# Patient Record
Sex: Male | Born: 1950 | Race: White | Hispanic: No | Smoking: Former smoker
Health system: Southern US, Community
[De-identification: ages and names within clinical notes are randomized; demographics above are authoritative.]

## PROBLEM LIST (undated history)

## (undated) DIAGNOSIS — Z955 Presence of coronary angioplasty implant and graft: Secondary | ICD-10-CM

## (undated) DIAGNOSIS — I714 Abdominal aortic aneurysm, without rupture: Secondary | ICD-10-CM

## (undated) DIAGNOSIS — I10 Essential (primary) hypertension: Secondary | ICD-10-CM

## (undated) DIAGNOSIS — E785 Hyperlipidemia, unspecified: Secondary | ICD-10-CM

## (undated) DIAGNOSIS — R9431 Abnormal electrocardiogram [ECG] [EKG]: Secondary | ICD-10-CM

## (undated) DIAGNOSIS — I25709 Atherosclerosis of coronary artery bypass graft(s), unspecified, with unspecified angina pectoris: Principal | ICD-10-CM

## (undated) DIAGNOSIS — I219 Acute myocardial infarction, unspecified: Secondary | ICD-10-CM

## (undated) HISTORY — DX: Presence of coronary angioplasty implant and graft: Z95.5

## (undated) HISTORY — DX: Hyperlipidemia, unspecified: E78.5

## (undated) HISTORY — DX: Abdominal aortic aneurysm, without rupture: I71.4

## (undated) HISTORY — PX: CARDIAC SURGERY: SHX584

## (undated) HISTORY — DX: Essential (primary) hypertension: I10

## (undated) HISTORY — DX: Atherosclerosis of coronary artery bypass graft(s), unspecified, with unspecified angina pectoris: I25.709

## (undated) HISTORY — DX: Abnormal electrocardiogram (ECG) (EKG): R94.31

---

## 2015-07-06 ENCOUNTER — Encounter: Payer: Self-pay | Admitting: *Deleted

## 2015-07-06 ENCOUNTER — Emergency Department (INDEPENDENT_AMBULATORY_CARE_PROVIDER_SITE_OTHER): Payer: Self-pay

## 2015-07-06 ENCOUNTER — Emergency Department
Admission: EM | Admit: 2015-07-06 | Discharge: 2015-07-06 | Disposition: A | Discharge status: Other Acute Inpatient Hospital | Payer: Self-pay | Source: Home / Self Care | Attending: Emergency Medicine | Admitting: Emergency Medicine

## 2015-07-06 DIAGNOSIS — R0609 Other forms of dyspnea: Secondary | ICD-10-CM | POA: Insufficient documentation

## 2015-07-06 DIAGNOSIS — R918 Other nonspecific abnormal finding of lung field: Secondary | ICD-10-CM

## 2015-07-06 DIAGNOSIS — R072 Precordial pain: Secondary | ICD-10-CM

## 2015-07-06 DIAGNOSIS — I10 Essential (primary) hypertension: Secondary | ICD-10-CM

## 2015-07-06 HISTORY — DX: Essential (primary) hypertension: I10

## 2015-07-06 HISTORY — DX: Acute myocardial infarction, unspecified: I21.9

## 2015-07-06 MED ORDER — NITROGLYCERIN 0.4 MG SL SUBL
0.4000 mg | SUBLINGUAL_TABLET | Freq: Once | SUBLINGUAL | Status: AC
  Administered 2015-07-06: 0.4 mg via SUBLINGUAL

## 2015-07-06 MED ORDER — GI COCKTAIL ~~LOC~~
30.0000 mL | Freq: Once | ORAL | Status: AC
  Administered 2015-07-06: 30 mL via ORAL

## 2015-07-06 NOTE — ED Notes (Signed)
Patient denies any change of symptoms post NTG. Still feels slight pressure/burning in chest with deep breath or cough.

## 2015-07-06 NOTE — ED Notes (Addendum)
Pt c/o cough, fatigue, HA and burning in chest x 1 week. Afebrile. SOB at times

## 2015-07-06 NOTE — ED Provider Notes (Addendum)
CSN: [id number]     Arrival date & time 07/06/15  5621 History   First MD Initiated Contact with Patient 07/06/15 0940     Chief Complaint  Patient presents with  . URI     Patient is a 64 y.o. male presenting with URI and chest pain. The history is provided by the patient.  URI Presenting symptoms: congestion (Anterior chest), cough (Nonproductive, 2 days) and sore throat (Minor symptom. Vague irritation in back of throat)   Presenting symptoms: no ear pain, no facial pain, no fatigue, no fever and no rhinorrhea   Severity:  Moderate Onset quality:  Gradual Duration:  7 days Timing:  Constant Progression:  Worsening Chronicity:  New Relieved by: Nothing in particular. Worsened by:  Breathing Associated symptoms: headaches (Mild, nonspecific. No focal neurologic symptoms. No change in vision)   Associated symptoms: no swollen glands and no wheezing   Associated symptoms comment:  Denies any new neck pain. He has occasional posterior neck pain that he feels is from arthritis, worse with moving his neck.--Not associated with chest discomfort Risk factors: chronic cardiac disease   Risk factors: no recent travel and no sick contacts   Chest Pain Chest pain location: Midline upper chest. Pain quality: burning   Pain radiates to:  Does not radiate Pain severity:  Moderate Onset quality:  Gradual Duration:  7 days Timing:  Intermittent Progression:  Worsening Context: breathing   Context: not eating and no trauma   Context comment:  Hurts to press on upper anterior chest Relieved by:  Rest Worsened by:  Exertion Associated symptoms: cough (Nonproductive, 2 days), dysphagia (Vague, nonspecific, mild), headache (Mild, nonspecific. No focal neurologic symptoms. No change in vision), heartburn (Occasional. Not currently that he can tell. He tried Tums this morning without improvement. ), shortness of breath (Worsens with exertion. For one week) and weakness (In general, feels weak at  times without any focal weakness)   Associated symptoms: no abdominal pain, no back pain, no diaphoresis, no dizziness, no fatigue, no fever, no lower extremity edema, no nausea, no numbness, no palpitations and not vomiting    The upper anterior midline chest discomfort is dull, burning, pleuritic, worse with coughing. 3 out of 10 at rest. 7 out of 10 with exertion. He states his PCP was in [facility], but is no longer available. He states he no longer has a PCP. He states last time he saw his PCP was 2 years ago. His cardiologist is at [facility]. Past medical history of MI 2 and CABG 2016. Has not followed up there  since 2014.  Family history: Mother had CHF, he is not sure if she had CAD. He is not sure age of onset of CHF. Father had lung and stomach cancer reviewed Past Medical History  Diagnosis Date  . Heart attack (HCC)   . Hypertension    Past Surgical History  Procedure Laterality Date  . Cardiac surgery     Family History  Problem Relation Age of Onset  . Congestive Heart Failure Mother   . Cancer Father     Lung and stomach CA   Social History  Substance Use Topics  . Smoking status: Former Smoker    Types: Cigarettes    Quit date: 07/05/2005  . Smokeless tobacco: Never Used  . Alcohol Use: No    Review of Systems  Constitutional: Negative for fever, diaphoresis and fatigue.  HENT: Positive for congestion (Anterior chest), sore throat (Minor symptom. Vague irritation in back  of throat) and trouble swallowing (Vague, nonspecific, mild). Negative for ear pain and rhinorrhea.   Respiratory: Positive for cough (Nonproductive, 2 days) and shortness of breath (Worsens with exertion. For one week). Negative for wheezing.   Cardiovascular: Positive for chest pain. Negative for palpitations.  Gastrointestinal: Positive for heartburn (Occasional. Not currently that he can tell. He tried Tums this morning without improvement. ). Negative for nausea, vomiting and  abdominal pain.  Musculoskeletal: Negative for back pain.  Neurological: Positive for weakness (In general, feels weak at times without any focal weakness) and headaches (Mild, nonspecific. No focal neurologic symptoms. No change in vision). Negative for dizziness and numbness.  All other systems reviewed and are negative. See also history of present illness.  Allergies  Vicodin  Home Medications   Prior to Admission medications   Medication Sig Start Date End Date Taking? Authorizing Provider  aspirin EC 81 MG tablet Take 81 mg by mouth daily.   Yes Historical Provider, MD  atorvastatin (LIPITOR) 80 MG tablet Take 80 mg by mouth daily.   Yes Historical Provider, MD  carvedilol (COREG) 6.25 MG tablet Take 6.25 mg by mouth 2 (two) times daily with a meal.   Yes Historical Provider, MD  clopidogrel (PLAVIX) 75 MG tablet Take 75 mg by mouth daily.   Yes Historical Provider, MD  isosorbide dinitrate (ISORDIL) 30 MG tablet Take 30 mg by mouth 4 (four) times daily.   Yes Historical Provider, MD   Meds Ordered and Administered this Visit   Medications  gi cocktail (Maalox,Lidocaine,Donnatal) (30 mLs Oral Given 07/06/15 1111)  nitroGLYCERIN (NITROSTAT) SL tablet 0.4 mg (0.4 mg Sublingual Given 07/06/15 1128)    BP 138/75 mmHg  Pulse 82  Temp(Src) 98.6 F (37 C) (Oral)  Resp 18  Ht 5\' 11"  (1.803 m)  Wt 250 lb (113.399 kg)  BMI 34.88 kg/m2  SpO2 96% No data found.   Physical Exam  Constitutional: He is oriented to person, place, and time. He appears well-developed and well-nourished. No distress.  Occasionally has nonspecific nonproductive cough. He appears worried.  HENT:  Head: Normocephalic and atraumatic.  Mouth/Throat: Oropharynx is clear and moist. No oropharyngeal exudate.  Eyes: Conjunctivae are normal. Pupils are equal, round, and reactive to light. No scleral icterus.  Neck: Normal range of motion. Neck supple. No JVD present.  Cardiovascular: Normal rate, regular  rhythm and intact distal pulses.  Exam reveals no gallop and no friction rub.   No murmur heard. Pulmonary/Chest: Effort normal and breath sounds normal. No stridor. He has no wheezes. He exhibits tenderness (mild, nonspecific tenderness to palpation  upper anterior chest  wall).  Abdominal: Soft. There is no tenderness.  Musculoskeletal: Normal range of motion. He exhibits no edema.  Lymphadenopathy:    He has no cervical adenopathy.  Neurological: He is alert and oriented to person, place, and time. No cranial nerve deficit.  No focal weakness  Skin: Skin is warm and dry. No rash noted. He is not diaphoretic.  Psychiatric: He has a normal mood and affect.    ED Course  ED EKG  Date/Time: 07/06/2015 11:09 AM Performed by: Georgina PillionMASSEY, DAVID Authorized by: Lajean ManesMASSEY, DAVID Interpreted by ED physician Previous ECG: no previous ECG available Rhythm: sinus rhythm Rate: normal BPM: 72 Conduction: conduction normal ST Elevation: V2, V3 and V4 (0.5 mm ST elevation in V2, V3, V4) T depression: V5 and V6 Clinical impression: abnormal ECG Comments: No old EKG available   GI cocktail given.--No improvement Nitroglycerin 0.4 sublingually given,  no significant improvement afterward.  Labs Review Labs Reviewed - No data to display  Imaging Review Dg Chest 2 View  07/06/2015  CLINICAL DATA:  Cough the T shortness of breath and burning in the chest for the past week, previous smoker, history of previous CABG. EXAM: CHEST  2 VIEW COMPARISON:  PA and lateral chest x-ray of July 03, 2013 FINDINGS: The lungs are adequately inflated. There is pleural thickening or pleural fluid posteriorly and laterally in the right hemi- thorax. The lateral component is more conspicuous than in the past. The left lung is clear. The heart and pulmonary vascularity are normal. The patient has undergone previous CABG. The mediastinum is normal in width. The bony thorax exhibits no acute abnormality. IMPRESSION: 1. There  is no evidence of pulmonary edema or alveolar pneumonia. 2. There is pleural thickening or pleural fluid at the right lung base. The configuration of this finding is different than in the however. Correlation with the patient's clinical exam and clinical symptoms is needed to exclude active pathology here. Chest CT scanning may be ultimately indicated to more fully evaluate the pleural spaces. Electronically Signed   By: David  [place] M.D.   On: 07/06/2015 12:20     MDM   1. Precordial pain    in the differential includes pleurisy, viral syndrome, or reflux, but he had no improvement after GI cocktail, making reflux less likely. Possibly costochondritis. Chest x-ray shows "the lungs are adequately inflated". no evidence of pulmonary edema or pneumonia but there is some type of pleural thickening or pleural fluid at the right lung base, see chest x-ray report above. He certainly is at risk for cardiac event, given prior history of CAD.(And non-follow-up with any M.D. the past 2 years)  I explained to him that he might be having a subacute or acute heart event that is causing his symptoms.--Although vital signs are stable, O2 saturation repeated at 96% on room air.  ED EKG  Previous ECG: no previous ECG available Rhythm: sinus rhythm Rate: normal BPM: 72 Conduction: conduction normal ST Elevation: V2, V3 and V4 (0.5 mm ST elevation in V2, V3, V4) T wave depression/inversion: V5 and V6 Clinical impression: abnormal ECG--does not meet criteria for STEMI    I explained to him that he might be having a subacute or acute heart event that is causing his symptoms. I advised transport stat via EMS to Hospital. He initially refused transport.  He is having family member come here to [facility],to drive him to emergency room. I explained risks of not allowing transport to hospital emergency room. Explain risks of death en route if he is driven by family member. Rechecked vital signs which  were stable, including O2 saturation 96% on room air. He declined aspirin. He declined oxygen.  11:53 PM. His wife arrived and I was able to convince patient and wife to allow [place] to call EMS for transport. They prefer transport to [facility], where he had his heart surgery 2014, and where his cardiologist at [facility] is associated.  EMS arrived and transported patient.  Lajean Manes, MD 07/06/15 1201  Lajean Manes, MD 07/06/15 [phone]

## 2015-07-10 ENCOUNTER — Telehealth: Payer: Self-pay | Admitting: *Deleted

## 2015-07-16 DIAGNOSIS — G4700 Insomnia, unspecified: Secondary | ICD-10-CM | POA: Insufficient documentation

## 2016-05-31 ENCOUNTER — Encounter: Payer: Self-pay | Admitting: Emergency Medicine

## 2016-05-31 ENCOUNTER — Emergency Department (INDEPENDENT_AMBULATORY_CARE_PROVIDER_SITE_OTHER): Payer: Self-pay

## 2016-05-31 ENCOUNTER — Emergency Department
Admission: EM | Admit: 2016-05-31 | Discharge: 2016-05-31 | Disposition: A | Discharge status: Home / Self Care | Payer: Self-pay | Source: Home / Self Care | Attending: Family Medicine | Admitting: Family Medicine

## 2016-05-31 DIAGNOSIS — M25511 Pain in right shoulder: Secondary | ICD-10-CM

## 2016-05-31 DIAGNOSIS — S4991XA Unspecified injury of right shoulder and upper arm, initial encounter: Secondary | ICD-10-CM

## 2016-05-31 DIAGNOSIS — W11XXXA Fall on and from ladder, initial encounter: Secondary | ICD-10-CM

## 2016-05-31 NOTE — ED Triage Notes (Signed)
Pt c/o right shoulder pain after falling from a ladder around noon today. States he landed on his right shoulder. Decrease ROM. Took ibuprofen at 1pm.

## 2016-05-31 NOTE — ED Provider Notes (Signed)
CSN: [id number]     Arrival date & time 05/31/16  1628 History   First MD Initiated Contact with Patient 05/31/16 1642     Chief Complaint  Patient presents with  . Shoulder Pain   (Consider location/radiation/quality/duration/timing/severity/associated sxs/prior Treatment) HPI Jerome Chase is a 65 y.o. male presenting to UC with c/o sudden onset Right shoulder pain with limited ROM after he fell off a ladder and landed onto his shoulder around noon today.  He took ibuprofen at 1PM but pain still present. Pt concerned he may have broken something.  Pain is 6/10, worse with movement. He is Right hand dominant. Denies hitting his head or any other injuries.    Past Medical History:  Diagnosis Date  . Heart attack (HCC)   . Hypertension    Past Surgical History:  Procedure Laterality Date  . CARDIAC SURGERY     Family History  Problem Relation Age of Onset  . Congestive Heart Failure Mother   . Cancer Father     Lung and stomach CA   Social History  Substance Use Topics  . Smoking status: Former Smoker    Types: Cigarettes    Quit date: 07/05/2005  . Smokeless tobacco: Never Used  . Alcohol use No    Review of Systems  Musculoskeletal: Positive for arthralgias and myalgias. Negative for back pain, joint swelling, neck pain and neck stiffness.       Right shoulder  Skin: Negative for color change and wound.  Neurological: Positive for weakness (Right shoulder due to pain). Negative for dizziness, syncope, light-headedness, numbness and headaches.    Allergies  Vicodin [hydrocodone-acetaminophen]  Home Medications   Prior to Admission medications   Medication Sig Start Date End Date Taking? Authorizing Provider  aspirin EC 81 MG tablet Take 81 mg by mouth daily.    Historical Provider, MD  atorvastatin (LIPITOR) 80 MG tablet Take 80 mg by mouth daily.    Historical Provider, MD  carvedilol (COREG) 6.25 MG tablet Take 6.25 mg by mouth 2 (two) times daily with a  meal.    Historical Provider, MD  clopidogrel (PLAVIX) 75 MG tablet Take 75 mg by mouth daily.    Historical Provider, MD  isosorbide dinitrate (ISORDIL) 30 MG tablet Take 30 mg by mouth 4 (four) times daily.    Historical Provider, MD   Meds Ordered and Administered this Visit  Medications - No data to display  BP 152/76 (BP Location: Right Arm)   Pulse 60   Temp 97.6 F (36.4 C) (Oral)   Wt 248 lb (112.5 kg)   SpO2 97%   BMI 34.59 kg/m  No data found.   Physical Exam  Constitutional: He is oriented to person, place, and time. He appears well-developed and well-nourished.  HENT:  Head: Normocephalic and atraumatic.  Eyes: EOM are normal.  Neck: Normal range of motion. Neck supple.  No midline bone tenderness, no crepitus or step-offs.   Cardiovascular: Normal rate.   Pulses:      Radial pulses are 2+ on the right side.  Pulmonary/Chest: Effort normal.  Musculoskeletal: He exhibits tenderness. He exhibits no edema.  Right shoulder: no edema or deformity. Tenderness to anterior aspect and over deltoid.  Full adduction and near full abduction but limited due to pain.  Right elbow: full ROM, non-tender. No midline spinal tenderness.  Neurological: He is alert and oriented to person, place, and time.  Skin: Skin is warm and dry. Capillary refill takes less than 2 seconds.  Right shoulder: skin in tact, no ecchymosis or erythema.  Psychiatric: He has a normal mood and affect. His behavior is normal.  Nursing note and vitals reviewed.   Urgent Care Course   Clinical Course    Procedures (including critical care time)  Labs Review Labs Reviewed - No data to display  Imaging Review Dg Shoulder Right  Result Date: 05/31/2016 CLINICAL DATA:  Initial encounter for Pt states he fell off a ladder this afternoon about 5 feet up and landed on concrete. States he has been having right shoulder pain on the lateral side with decreased ROM since. EXAM: RIGHT SHOULDER - 2+ VIEW  COMPARISON:  None. FINDINGS: Degenerate changes of the undersurface of the acromioclavicular joint. Prior median sternotomy. No acute fracture or dislocation. Mild glenohumeral joint osteoarthritis as well. IMPRESSION: Degenerative change, without acute osseous finding. Electronically Signed   By: Jeronimo Greaves M.D.   On: 05/31/2016 17:04    MDM   1. Right shoulder injury, initial encounter   2. Fall from ladder, initial encounter    Pt c/o Right shoulder pain after fall from ladder. No other injury.  Plain films: no acute findings.   Reassured pt no fracture or dislocation, however advised he may have a partial rotator cuff tear. Recommended sling for comfort and home exercises. Pt declined sling.  Pt is on Plavix, adamant he did not hit his head or have any other injuries from fall. Encouraged to call 911 or go to closest emergency department if he develops new symptoms this evening.  F/u with Sports Medicine in 1 week if not improving.  Patient verbalized understanding and agreement with treatment plan.     Junius Finner, PA-C 05/31/16 1732

## 2017-01-24 ENCOUNTER — Emergency Department
Admission: EM | Admit: 2017-01-24 | Discharge: 2017-01-24 | Disposition: A | Discharge status: Home / Self Care | Payer: Self-pay | Source: Home / Self Care | Attending: Family Medicine | Admitting: Family Medicine

## 2017-01-24 ENCOUNTER — Encounter: Payer: Self-pay | Admitting: Emergency Medicine

## 2017-01-24 ENCOUNTER — Telehealth: Payer: Self-pay | Admitting: Emergency Medicine

## 2017-01-24 ENCOUNTER — Ambulatory Visit (HOSPITAL_BASED_OUTPATIENT_CLINIC_OR_DEPARTMENT_OTHER)
Admit: 2017-01-24 | Discharge: 2017-01-24 | Disposition: A | Discharge status: Home / Self Care | Payer: Medicare (Managed Care) | Source: Ambulatory Visit | Attending: Family Medicine | Admitting: Family Medicine

## 2017-01-24 DIAGNOSIS — R1013 Epigastric pain: Secondary | ICD-10-CM

## 2017-01-24 DIAGNOSIS — R935 Abnormal findings on diagnostic imaging of other abdominal regions, including retroperitoneum: Secondary | ICD-10-CM | POA: Insufficient documentation

## 2017-01-24 DIAGNOSIS — R1011 Right upper quadrant pain: Secondary | ICD-10-CM

## 2017-01-24 DIAGNOSIS — M549 Dorsalgia, unspecified: Secondary | ICD-10-CM

## 2017-01-24 DIAGNOSIS — K760 Fatty (change of) liver, not elsewhere classified: Secondary | ICD-10-CM | POA: Insufficient documentation

## 2017-01-24 LAB — COMPLETE METABOLIC PANEL WITH GFR
ALT: 37 U/L (ref 9–46)
AST: 26 U/L (ref 10–35)
Albumin: 4.5 g/dL (ref 3.6–5.1)
Alkaline Phosphatase: 80 U/L (ref 40–115)
BUN: 12 mg/dL (ref 7–25)
CO2: 21 mmol/L (ref 20–31)
Calcium: 9.2 mg/dL (ref 8.6–10.3)
Chloride: 103 mmol/L (ref 98–110)
Creat: 1.05 mg/dL (ref 0.70–1.25)
GFR, Est African American: 86 mL/min (ref >=60)
GFR, Est Non African American: 74 mL/min (ref >=60)
Glucose, Bld: 159 mg/dL — ABNORMAL HIGH (ref 65–99)
Potassium: 4.3 mmol/L (ref 3.5–5.3)
Sodium: 136 mmol/L (ref 135–146)
Total Bilirubin: 0.6 mg/dL (ref 0.2–1.2)
Total Protein: 7 g/dL (ref 6.1–8.1)

## 2017-01-24 LAB — LIPASE: Lipase: 43 U/L (ref 7–60)

## 2017-01-24 NOTE — ED Provider Notes (Signed)
CSN: [id number]     Arrival date & time 01/24/17  [id number] History   First MD Initiated Contact with Patient 01/24/17 [phone]     Chief Complaint  Patient presents with  . Abdominal Pain   (Consider location/radiation/quality/duration/timing/severity/associated sxs/prior Treatment) HPI Jerome Chase is a 66 y.o. male presenting to UC with c/o epigastric pain that started about 5 days ago, waxes and wanes. Aching and sore, worse with certain movements, radiates to his back. 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  Denies chest pain, SOB, or acid reflux. Denies fever, chills, n/v/d. No hx of pancreatitis or gallbladder issues.  He has an extensive cardiac hx including a CABG.  He called his cardiologist earlier this week about symptoms. Doubtful related to his heart as pt just f/u with cardiologist last month and even wore a Holter monitor, results were unremarkable per notes on CareEverywhere from 12/15/16.  Pt states this does not feel like his prior chest/heart pain.     Past Medical History:  Diagnosis Date  . Heart attack (HCC)   . Hypertension    Past Surgical History:  Procedure Laterality Date  . CARDIAC SURGERY     Family History  Problem Relation Age of Onset  . Congestive Heart Failure Mother   . Cancer Father        Lung and stomach CA   Social History  Substance Use Topics  . Smoking status: Former Smoker    Types: Cigarettes    Quit date: 07/05/2005  . Smokeless tobacco: Never Used  . Alcohol use No    Review of Systems  Constitutional: Negative for chills and fever.  HENT: Negative for congestion, ear pain, sore throat, trouble swallowing and voice change.   Respiratory: Negative for cough and shortness of breath.   Cardiovascular: Negative for chest pain and palpitations.  Gastrointestinal: Positive for abdominal pain (Epigastric, RUQ).  Negative for diarrhea, nausea and vomiting.  Genitourinary: Negative for flank pain.  Musculoskeletal: Positive for back pain (Right mid back). Negative for arthralgias and myalgias.  Skin: Negative for rash.    Allergies  Vicodin [hydrocodone-acetaminophen]  Home Medications   Prior to Admission medications   Medication Sig Start Date End Date Taking? Authorizing Provider  aspirin EC 81 MG tablet Take 81 mg by mouth daily.    [provider]  atorvastatin (LIPITOR) 80 MG tablet Take 80 mg by mouth daily.    [provider]  carvedilol (COREG) 6.25 MG tablet Take 6.25 mg by mouth 2 (two) times daily with a meal.    [provider]  clopidogrel (PLAVIX) 75 MG tablet Take 75 mg by mouth daily.    [provider]  isosorbide dinitrate (ISORDIL) 30 MG tablet Take 30 mg by mouth 4 (four) times daily.    [provider]   Meds Ordered and Administered this Visit  Medications - No data to display  BP 135/86 (BP Location: Right Arm)   Pulse (!) 58   Temp 97.8 F (36.6 C) (Oral)   Wt 246 lb (111.6 kg)   SpO2 96%   BMI 34.31 kg/m  No data found.   Physical Exam  Constitutional: He is oriented to person, place, and time. He appears well-developed and well-nourished. No distress.  HENT:  Head: Normocephalic and atraumatic.  Eyes: EOM are normal.  Neck: Normal range of motion. Neck  supple.  Cardiovascular: Normal rate and regular rhythm.   Pulmonary/Chest: Effort normal and breath sounds normal. No respiratory distress. He has no wheezes. He has no rales.  Abdominal: Soft. He exhibits no distension and no mass. There is tenderness. There is no rebound, no guarding and no CVA tenderness.    Mild tenderness in epigastrium and RUQ  Musculoskeletal: Normal range of motion.  Neurological: He is alert and oriented to person, place, and time.  Skin: Skin is warm and dry. He is not diaphoretic.  Psychiatric: He has a normal mood and affect. His  behavior is normal.  Nursing note and vitals reviewed.   Urgent Care Course     Procedures (including critical care time)  Labs Review Labs Reviewed  COMPLETE METABOLIC PANEL WITH GFR  LIPASE  POCT CBC W AUTO DIFF ([facility])    Imaging Review US Abdomen Complete  Result Date: 01/24/2017 CLINICAL DATA:  Right upper quadrant pain for 1 week. EXAM: ABDOMEN ULTRASOUND COMPLETE COMPARISON:  None. FINDINGS: Gallbladder: No gallstones or wall thickening visualized. No sonographic Murphy sign noted by sonographer. Common bile duct: Diameter: 4 mm. Liver: Diffusely increased in echogenicity without focal mass. IVC: No abnormality visualized. Pancreas: Visualized portion unremarkable. Spleen: Size and appearance within normal limits. Right Kidney: Length: 13.6 cm. Echogenicity within normal limits. No mass or hydronephrosis visualized. Left Kidney: Length: 14.6 cm. Normal echogenicity. There is a fluid collection in the central lower pole of the left kidney most likely representing hydronephrosis which is low worse in the lower pole there no definite solid mass. Abdominal aorta: Portions of the aorta were obscured. Maximal visualized diameter is 3.1 cm. Other findings: None. IMPRESSION: Possible left hydronephrosis. CT can be performed to further delineate. Portions of the aorta were obscured. Maximal visualized caliber was 3.1 cm. Recommend followup by ultrasound in 3 years. This recommendation follows ACR consensus guidelines: White Paper of the ACR Incidental Findings Committee II on Vascular Findings. J Am Coll Radiol 2013; 10:789-794. Diffuse hepatic steatosis. Electronically Signed   By: Jolaine Click M.D.   On: 01/24/2017 10:28     MDM   1. Acute epigastric pain   2. Right upper quadrant abdominal pain   3. Mid back pain on right side    Pt c/o epigastric and RUQ abdominal pain that occasionally radiates to back. Denies chest pain or SOB. Pain is worse with movement.   Pt sent  to [facility] for U/S abdomen   10:36 AM discussed U/S results with pt. Unsure what is causing pt's symptoms at this time.  Offered to have pt return to this UC for a GI cocktail and EKG, however, pain is atypical for ACS and pt had recent normal workup with his cardiologist.  Pt plans to f/u with PCP next week if not improving.   Advised pt not to hesitate to call 911 or go to hospital if symptoms worsen. Pt agreeable.     Junius Finner, PA-C 01/24/17 1106

## 2017-01-24 NOTE — ED Triage Notes (Signed)
Pt c/o sternum pain that radiates to back with movement x1 week. Tender to touch, taking tylenol with no relief. Hx of heart complications.

## 2017-01-26 LAB — POCT CBC W AUTO DIFF (K'VILLE URGENT CARE)

## 2017-02-26 ENCOUNTER — Emergency Department
Admission: EM | Admit: 2017-02-26 | Discharge: 2017-02-26 | Disposition: A | Discharge status: Home / Self Care | Payer: Self-pay | Source: Home / Self Care | Attending: Family Medicine | Admitting: Family Medicine

## 2017-02-26 ENCOUNTER — Encounter: Payer: Self-pay | Admitting: Emergency Medicine

## 2017-02-26 ENCOUNTER — Emergency Department (INDEPENDENT_AMBULATORY_CARE_PROVIDER_SITE_OTHER): Payer: Self-pay

## 2017-02-26 DIAGNOSIS — R0781 Pleurodynia: Secondary | ICD-10-CM

## 2017-02-26 DIAGNOSIS — G8929 Other chronic pain: Secondary | ICD-10-CM

## 2017-02-26 DIAGNOSIS — R1013 Epigastric pain: Secondary | ICD-10-CM

## 2017-02-26 DIAGNOSIS — M62830 Muscle spasm of back: Secondary | ICD-10-CM

## 2017-02-26 MED ORDER — METHOCARBAMOL 500 MG PO TABS: 500.0000 mg | ORAL_TABLET | Freq: Two times a day (BID) | ORAL | 0 refills | Status: DC | PRN

## 2017-02-26 NOTE — ED Triage Notes (Signed)
Mid-back pain, epigastric pain started yesterday afternoon, worse last night, couldn't sleep. Back spasms.

## 2017-02-26 NOTE — ED Provider Notes (Signed)
CSN: [id number]     Arrival date & time 02/26/17  1120 History   First MD Initiated Contact with Patient 02/26/17 1140     Chief Complaint  Patient presents with  . Back Pain   (Consider location/radiation/quality/duration/timing/severity/associated sxs/prior Treatment) HPI Jerome Chase is a 66 y.o. male presenting to UC with c/o bilateral mid back pain that started yesterday afternoon, worsened last night making it difficult for him to sleep. He also developed epigastric pain and is unsure which came first or if one is causing the other. [facility].  Labs including CMP and Lipase were normal.  Ultrasound of upper abdomen was also normal, no cause was found for pt's reported pain then. He does report hx of acid reflux and is suppose to take Nexium but notes he only takes as needed. He is concerned if he takes it too often it will not work anymore.  He has tried Tums but no relief. He also tried Naproxen w/o.  He does have hx of MI and cardiac surgery so he takes Plavix.  He f/u with cardiologist routinely but he does not have a PCP. He has not tried to establish care with a PCP since [facility].   Past Medical History:  Diagnosis Date  . Heart attack (HCC)   . Hypertension    Past Surgical History:  Procedure Laterality Date  . CARDIAC SURGERY     Family History  Problem Relation Age of Onset  . Congestive Heart Failure Mother   . Cancer Father        Lung and stomach CA   Social History  Substance Use Topics  . Smoking status: Former Smoker    Types: Cigarettes    Quit date: 07/05/2005  . Smokeless tobacco: Never Used  . Alcohol use No    Review of Systems  Constitutional: Negative for chills and fever.  HENT: Negative for congestion, ear pain, sore throat, trouble swallowing and voice change.   Respiratory: Negative for cough and shortness of breath.   Cardiovascular: Positive for chest pain (under bilateral lower  ribcage). Negative for palpitations.  Gastrointestinal: Positive for abdominal pain. Negative for diarrhea, nausea and vomiting.  Musculoskeletal: Positive for back pain and myalgias. Negative for arthralgias, neck pain and neck stiffness.  Skin: Negative for color change, rash and wound.  All other systems reviewed and are negative.   Allergies  Vicodin [hydrocodone-acetaminophen]  Home Medications   Prior to Admission medications   Medication Sig Start Date End Date Taking? Authorizing Provider  aspirin EC 81 MG tablet Take 81 mg by mouth daily.    [provider]  atorvastatin (LIPITOR) 80 MG tablet Take 80 mg by mouth daily.    [provider]  carvedilol (COREG) 6.25 MG tablet Take 6.25 mg by mouth 2 (two) times daily with a meal.    [provider]  clopidogrel (PLAVIX) 75 MG tablet Take 75 mg by mouth daily.    [provider]  isosorbide dinitrate (ISORDIL) 30 MG tablet Take 30 mg by mouth 4 (four) times daily.    [provider]  methocarbamol (ROBAXIN) 500 MG tablet Take 1 tablet (500 mg total) by mouth 2 (two) times daily as needed for muscle spasms. 02/26/17   Lurene ShadowPhelps, Renada Cronin O, PA-C   Meds Ordered and Administered this Visit  Medications - No data to display  BP (!) 149/82 (BP Location: Left Arm)   Pulse Marland Kitchen(!)  54   Temp 97.6 F (36.4 C) (Oral)   Ht 6' (1.829 m)   Wt 243 lb (110.2 kg)   SpO2 96%   BMI 32.96 kg/m  No data found.   Physical Exam  Constitutional: He is oriented to person, place, and time. He appears well-developed and well-nourished. No distress.  HENT:  Head: Normocephalic and atraumatic.  Mouth/Throat: Oropharynx is clear and moist.  Eyes: EOM are normal.  Neck: Normal range of motion.  Cardiovascular: Normal rate and regular rhythm.   Pulmonary/Chest: Effort normal and breath sounds normal. No respiratory distress. He has no wheezes. He has no rales. He exhibits tenderness (along lower ribcage. no  deformity or crepitus.).  Abdominal: Soft. He exhibits no distension and no mass. There is tenderness (across upper abdomen, worst in epigastrium). There is no rebound and no guarding.  Musculoskeletal: Normal range of motion. He exhibits tenderness. He exhibits no edema.  No midline spinal tenderness. Tenderness to bilateral mid-thoracic muscles.  Full ROM upper and lower extremities. Increased pain with certain movements during exam  Neurological: He is alert and oriented to person, place, and time.  Skin: Skin is warm and dry. No rash noted. He is not diaphoretic. No erythema.  No rash or ecchymosis on abdomen or back  Psychiatric: He has a normal mood and affect. His behavior is normal.  Nursing note and vitals reviewed.   Urgent Care Course     Procedures (including critical care time)  Labs Review Labs Reviewed - No data to display  Imaging Review Dg Chest 2 View  Result Date: 02/26/2017 CLINICAL DATA:  Lower rib and epigastric pain. EXAM: CHEST  2 VIEW COMPARISON:  CT chest and PA and lateral chest 07/06/2015. FINDINGS: The patient has a very small, chronic right pleural effusion versus pleural scar. The lungs are clear. No pneumothorax. The patient is status post CABG. Heart size is normal. Aortic atherosclerosis is noted. No acute bony abnormality. IMPRESSION: No acute disease. Electronically Signed   By: Drusilla Kanner M.D.   On: 02/26/2017 12:16    MDM   1. Muscle spasm of back   2. Abdominal pain, chronic, epigastric    CXR: unremarkable Labs and imaging from last visit unremarkable  Back pain most c/w muscle spasms.   Epigastric pain- questionably due to gastric or duodenal ulcer. Offered to give referral info to [facility]. Pt declined but is somoewhat more open to establishing care with a PCP.  Encouraged to start taking his Nexium again for at least 2 weeks to see if that helps. Will treat back muscle spasms in meantime. Rx: Robaxin.  Discussed risk of drowsiness, advised not to drive or drink alcohol while taking  F/u with PCP for continued care and further evaluation of chronic epigastric pain.    Lurene Shadow, [place] 02/26/17 1354

## 2017-02-26 NOTE — Discharge Instructions (Signed)
°  Robaxin (methocarbamol) is a muscle relaxer and may cause drowsiness. Do not drink alcohol, drive, or operate heavy machinery while taking. ° °

## 2017-05-07 LAB — PSA: PSA: 0.48

## 2017-05-07 LAB — HM HEPATITIS C SCREENING LAB: HM HEPATITIS C SCREENING: NEGATIVE

## 2017-05-20 DIAGNOSIS — I25709 Atherosclerosis of coronary artery bypass graft(s), unspecified, with unspecified angina pectoris: Secondary | ICD-10-CM

## 2017-05-20 DIAGNOSIS — R0782 Intercostal pain: Secondary | ICD-10-CM | POA: Insufficient documentation

## 2017-05-20 DIAGNOSIS — E7849 Other hyperlipidemia: Secondary | ICD-10-CM | POA: Insufficient documentation

## 2017-05-20 HISTORY — DX: Atherosclerosis of coronary artery bypass graft(s), unspecified, with unspecified angina pectoris: I25.709

## 2017-05-28 DIAGNOSIS — Z955 Presence of coronary angioplasty implant and graft: Secondary | ICD-10-CM

## 2017-05-28 DIAGNOSIS — R9389 Abnormal findings on diagnostic imaging of other specified body structures: Secondary | ICD-10-CM | POA: Insufficient documentation

## 2017-05-28 DIAGNOSIS — E785 Hyperlipidemia, unspecified: Secondary | ICD-10-CM

## 2017-05-28 HISTORY — DX: Presence of coronary angioplasty implant and graft: Z95.5

## 2017-05-28 HISTORY — DX: Hyperlipidemia, unspecified: E78.5

## 2017-09-10 LAB — CBC AND DIFFERENTIAL
HCT: 47 (ref 41–53)
HEMOGLOBIN: 16.4 (ref 13.5–17.5)
Platelets: 203 (ref 150–399)
WBC: 7.7

## 2017-09-10 LAB — VITAMIN D 25 HYDROXY (VIT D DEFICIENCY, FRACTURES): Vit D, 25-Hydroxy: 21.52

## 2017-09-10 LAB — LIPID PANEL
CHOLESTEROL: 245 — AB (ref 0–200)
HDL: 34 — AB (ref 35–70)
LDL Cholesterol: 135
TRIGLYCERIDES: 384 — AB (ref 40–160)

## 2017-09-10 LAB — BASIC METABOLIC PANEL
BUN: 14 (ref 4–21)
Creatinine: 1.2 (ref 0.6–1.3)
Glucose: 135
POTASSIUM: 3.8 (ref 3.4–5.3)
SODIUM: 137 (ref 137–147)

## 2017-09-10 LAB — VITAMIN B12: VITAMIN B 12: 494

## 2017-09-10 LAB — TSH: TSH: 4.5 (ref 0.41–5.90)

## 2017-09-10 LAB — HEPATIC FUNCTION PANEL
ALT: 63 — AB (ref 10–40)
AST: 41 — AB (ref 14–40)
Alkaline Phosphatase: 81 (ref 25–125)

## 2017-09-10 LAB — HEMOGLOBIN A1C: HEMOGLOBIN A1C: 6

## 2017-09-18 DIAGNOSIS — M659 Synovitis and tenosynovitis, unspecified: Secondary | ICD-10-CM | POA: Insufficient documentation

## 2017-09-18 DIAGNOSIS — M79671 Pain in right foot: Secondary | ICD-10-CM

## 2017-09-18 DIAGNOSIS — G8929 Other chronic pain: Secondary | ICD-10-CM | POA: Insufficient documentation

## 2017-09-18 DIAGNOSIS — L600 Ingrowing nail: Secondary | ICD-10-CM | POA: Insufficient documentation

## 2017-09-20 DIAGNOSIS — M21622 Bunionette of left foot: Secondary | ICD-10-CM

## 2017-09-20 DIAGNOSIS — M21621 Bunionette of right foot: Secondary | ICD-10-CM | POA: Insufficient documentation

## 2017-10-20 DIAGNOSIS — Z951 Presence of aortocoronary bypass graft: Secondary | ICD-10-CM | POA: Insufficient documentation

## 2017-10-28 ENCOUNTER — Encounter: Payer: Self-pay | Admitting: Osteopathic Medicine

## 2017-10-28 ENCOUNTER — Ambulatory Visit (INDEPENDENT_AMBULATORY_CARE_PROVIDER_SITE_OTHER): Payer: Medicare Other | Admitting: Osteopathic Medicine

## 2017-10-28 ENCOUNTER — Ambulatory Visit (INDEPENDENT_AMBULATORY_CARE_PROVIDER_SITE_OTHER): Payer: Medicare Other

## 2017-10-28 VITALS — BP 146/76 | HR 56 | Temp 97.7°F | Ht 72.0 in | Wt 254.0 lb

## 2017-10-28 DIAGNOSIS — R079 Chest pain, unspecified: Secondary | ICD-10-CM | POA: Insufficient documentation

## 2017-10-28 DIAGNOSIS — I714 Abdominal aortic aneurysm, without rupture, unspecified: Secondary | ICD-10-CM

## 2017-10-28 DIAGNOSIS — R9431 Abnormal electrocardiogram [ECG] [EKG]: Secondary | ICD-10-CM

## 2017-10-28 DIAGNOSIS — Z955 Presence of coronary angioplasty implant and graft: Secondary | ICD-10-CM | POA: Diagnosis not present

## 2017-10-28 DIAGNOSIS — L600 Ingrowing nail: Secondary | ICD-10-CM | POA: Diagnosis not present

## 2017-10-28 DIAGNOSIS — Z951 Presence of aortocoronary bypass graft: Secondary | ICD-10-CM | POA: Diagnosis not present

## 2017-10-28 DIAGNOSIS — E785 Hyperlipidemia, unspecified: Secondary | ICD-10-CM | POA: Diagnosis not present

## 2017-10-28 DIAGNOSIS — M1712 Unilateral primary osteoarthritis, left knee: Secondary | ICD-10-CM | POA: Insufficient documentation

## 2017-10-28 DIAGNOSIS — I25709 Atherosclerosis of coronary artery bypass graft(s), unspecified, with unspecified angina pectoris: Secondary | ICD-10-CM

## 2017-10-28 DIAGNOSIS — M25562 Pain in left knee: Secondary | ICD-10-CM | POA: Diagnosis not present

## 2017-10-28 DIAGNOSIS — I10 Essential (primary) hypertension: Secondary | ICD-10-CM | POA: Diagnosis not present

## 2017-10-28 DIAGNOSIS — M67972 Unspecified disorder of synovium and tendon, left ankle and foot: Secondary | ICD-10-CM | POA: Diagnosis not present

## 2017-10-28 HISTORY — DX: Abnormal electrocardiogram (ECG) (EKG): R94.31

## 2017-10-28 HISTORY — DX: Abdominal aortic aneurysm, without rupture, unspecified: I71.40

## 2017-10-28 HISTORY — DX: Abdominal aortic aneurysm, without rupture: I71.4

## 2017-10-28 NOTE — Progress Notes (Signed)
HPI: Jerome Chase is a 67 y.o. male who  has a past medical history of AAA (abdominal aortic aneurysm) (HCC) (10/28/2017), Abnormal EKG (10/28/2017), Coronary artery disease involving coronary bypass graft of native heart with angina pectoris (HCC) (05/20/2017), Dyslipidemia (05/28/2017), Essential hypertension (07/06/2015), Heart attack (HCC), Hypertension, and Status post coronary artery stent placement (05/28/2017).  he presents to [facility] today, 10/28/17,  for chief complaint of: New to establish care Hx CAD, HTN Knee pain   Limited records available. Very pleasant patient here to establish a primary care doctor.   MSK: L knee pain x1 week at this point, can't bend or straighten this due to pain. Neck pain, hx "compacted discs," was given flexeril at some point, he is nervous to take this "due to heart history." Following with spine doctor in [place], no records available, was given Flexeril, not a surgical candidate, didn't discuss injections.   Resp: Was told he had lung cancer but "not bad enough to do anything about" and following with pulmonology. (?nodule?)   Cardiac: Following with cardiology, most recent visit 10/20/2017: Coronary artery disease involving coronary bypass graft, with angina. Also hypertension, hyperlipidemia, status post coronary artery stent placement, status post aortocoronary bypass graft. At that time, with complaints of chest pain and flushing/lightheadedness without palpitations. Considering PCSK9 for target LDL, carvedilol was increased, regular exercise recommended. Patient declined culture/event monitor. Planning to follow up in 6 months. Stress echo September 2018 consistent with ischemia, anterior/anteroseptal walls. EKG 1.5 mm ST depression. I don't see labs in care everywhere other than most recent ones in [facility] from 06/08/2017.   Cath 06/2017: Diagnostic Procedure S ummary Chronic Total Occlusion of the  LAD, RCA Otherwise non-obstructive coronary artery disease. Mild restenosis of the OM Normal LV systolic function. LV ejection fraction is 70 % Diagnostic Procedure Recommendations Medical therapy for CAD Risk factor reduction, Angina  Foot issues: Following with podiatry for bunion and ingrown toenail issues. Mild infection of removed toenail, following last visit was 10/21/2017.    Past medical, surgical, social and family history reviewed:  Patient Active Problem List   Diagnosis Date Noted  . AAA (abdominal aortic aneurysm) (HCC) 10/28/2017  . Abnormal EKG 10/28/2017  . Chest pain 10/28/2017  . 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 Tailor's bunion of both feet 09/20/2017  . Ingrown nail 09/18/2017  . Tenosynovitis of right foot 09/18/2017  . Chronic heel pain, right 09/18/2017  . Dyslipidemia 05/28/2017  . Status post coronary artery stent placement 05/28/2017  . Abnormal chest CT 05/28/2017  . Coronary artery disease involving coronary bypass graft of native heart with angina pectoris (HCC) 05/20/2017  . Insomnia 07/16/2015  . Essential hypertension 07/06/2015  . Dyspnea on effort 07/06/2015    Past Surgical History:  Procedure Laterality Date  . CARDIAC SURGERY      Social History   Tobacco Use  . Smoking status: Former Smoker    Types: Cigarettes    Last attempt to quit: 07/05/2005    Years since quitting: 12.3  . Smokeless tobacco: Never Used  Substance Use Topics  . Alcohol use: No    Family History  Problem Relation Age of Onset  . Congestive Heart Failure Mother   . Cancer Father        Lung and stomach CA     Current medication list and allergy/intolerance information reviewed:    Current Outpatient Medications  Medication Sig Dispense Refill  . aspirin EC 81 MG  tablet Take 81 mg by mouth daily.    Marland Kitchen atorvastatin (LIPITOR) 80 MG tablet Take 80 mg by mouth daily.    . carvedilol (COREG) 6.25 MG tablet Take 6.25 mg by mouth 2  (two) times daily with a meal.    . clopidogrel (PLAVIX) 75 MG tablet Take 75 mg by mouth daily.    . isosorbide dinitrate (ISORDIL) 30 MG tablet Take 30 mg by mouth 4 (four) times daily.    . methocarbamol (ROBAXIN) 500 MG tablet Take 1 tablet (500 mg total) by mouth 2 (two) times daily as needed for muscle spasms. 12 tablet 0   No current facility-administered medications for this visit.    (Medication list as of most recent cardiology note: . acetaminophen (TYLENOL) 325 MG tablet, Take 650 mg by mouth every 6 (six) hours as needed., Disp: , Rfl:  . aspirin 81 MG EC tablet *ANTIPLATELET*, Take by mouth daily. , Disp: , Rfl:  . atorvastatin (LIPITOR) 80 MG tablet, Take 1 tablet (80 mg total) by mouth daily., Disp: 30 tablet, Rfl: 5 . carvedilol (COREG) 12.5 MG tablet, Take 12.5 mg by mouth 2 times daily with meals., Disp: , Rfl:  . clopidogrel (PLAVIX) 75 mg tablet *ANTIPLATELET*, Take 1 tablet (75 mg total) by mouth daily., Disp: 30 tablet, Rfl: 5 . ergocalciferol (VITAMIN D2) 50,000 unit capsule, Take 50,000 Units by mouth once a week., Disp: , Rfl:  . esomeprazole magnesium (NEXIUM ORAL), Take 20 mg by mouth as needed., Disp: , Rfl: taking once in awhile  . isosorbide mononitrate ER (IMDUR ER) 60 MG 24 hr tablet, Take 1 tablet (60 mg total) by mouth daily., Disp: 30 tablet, Rfl: 1 . nitroglycerin (NITROSTAT) 0.4 MG SL tablet, Place 0.4 mg under the tongue every 5 (five) minutes as needed for Chest pain. Call doctor/911 if take 2 doses. Max 3/day., Disp: , Rfl: )   Allergies  Allergen Reactions  . Vicodin [Hydrocodone-Acetaminophen] Nausea And Vomiting      Review of Systems:  Constitutional:  No  fever, no chills, No recent illness, No unintentional weight changes. No significant fatigue.   HEENT: No  headache, no vision change, no hearing change, No sore throat, No  sinus pressure  Cardiac: No  chest pain, No  pressure, No palpitations, No  Orthopnea  Respiratory:  No  shortness  of breath. No  Cough  Gastrointestinal: No  abdominal pain, No  nausea, No  vomiting,  No  blood in stool, No  diarrhea, No  constipation   Musculoskeletal: No new myalgia/arthralgia  Skin: No  Rash, No other wounds/concerning lesions  Genitourinary: No  incontinence, No  abnormal genital bleeding, No abnormal genital discharge  Hem/Onc: No  easy bruising/bleeding, No  abnormal lymph node  Endocrine: No cold intolerance,  No heat intolerance. No polyuria/polydipsia/polyphagia   Neurologic: No  weakness, No  dizziness, No  slurred speech/focal weakness/facial droop  Psychiatric: No  concerns with depression, No  concerns with anxiety, No sleep problems, No mood problems  Exam:  BP (!) 146/76   Pulse (!) 56   Temp 97.7 F (36.5 C) (Oral)   Ht 6' (1.829 m)   Wt 254 lb (115.2 kg)   BMI 34.45 kg/m   Constitutional: VS see above. General Appearance: alert, well-developed, well-nourished, NAD  Eyes: Normal lids and conjunctive, non-icteric sclera  Ears, Nose, Mouth, Throat: MMM, Normal external inspection ears/nares/mouth/lips/gums.   Neck: No masses, trachea midline. No thyroid enlargement. No tenderness/mass appreciated. No lymphadenopathy  Respiratory: Normal  respiratory effort. no wheeze, no rhonchi, no rales  Cardiovascular: S1/S2 normal, no murmur, no rub/gallop auscultated. RRR. No lower extremity edema.   Neurological: Normal balance/coordination. No tremor.  Skin: warm, dry, intact.     Psychiatric: Normal judgment/insight. Normal mood and affect. Oriented x3.    No results found for this or any previous visit (from the past 72 hour(s)).  Dg Knee Complete 4 Views Left  Result Date: 10/28/2017 CLINICAL DATA:  Medial left knee pain, bruising and swelling for 2 weeks. No known injury. Initial encounter. EXAM: LEFT KNEE - COMPLETE 4+ VIEW COMPARISON:  None. FINDINGS: No evidence of fracture, dislocation, or joint effusion. No evidence of arthropathy or other focal  bone abnormality. Soft tissues are unremarkable. IMPRESSION: Negative exam. Electronically Signed   By: Drusilla Kannerhomas  Dalessio M.D.   On: 10/28/2017 10:48     ASSESSMENT/PLAN: The primary encounter diagnosis was Coronary artery disease involving coronary bypass graft of native heart with angina pectoris (HCC). Diagnoses of Left medial knee pain, Acute pain of left knee, Left insertional Achilles tendinitis, Essential hypertension, Ingrown nail, Dyslipidemia, Presence of aortocoronary bypass graft, and Status post coronary artery stent placement were also pertinent to this visit.     Patient Instructions   Will get some records - sounds like we have a bit of a mess on our hands!  Will get Dr. Karie Schwalbe to have a look at the knee today.   Continue current medications and cardiology follow-up.   Let's have another visit in a month or so - hopefully by then I'll have some records from your previous primary, your orthopedic doctor and your lung doctor.     Visit summary with medication list and pertinent instructions was printed for patient to review. All questions at time of visit were answered - patient instructed to contact office with any additional concerns. ER/RTC precautions were reviewed with the patient.   Follow-up plan: Return in about 4 weeks (around 11/25/2017) for follow-up on neck issues, review labs, review lung testing .  Note: Total time spent 45 minutes, greater than 50% of the visit was spent face-to-face counseling and coordinating care for the following: The primary encounter diagnosis was Coronary artery disease involving coronary bypass graft of native heart with angina pectoris (HCC). Diagnoses of Left medial knee pain, Acute pain of left knee, Left insertional Achilles tendinitis, Essential hypertension, Ingrown nail, Dyslipidemia, Presence of aortocoronary bypass graft, and Status post coronary artery stent placement were also pertinent to this visit.Marland Kitchen.  Please note: voice  recognition software was used to produce this document, and typos may escape review. Please contact Dr. Lyn HollingsheadAlexander for any needed clarifications.

## 2017-10-28 NOTE — Assessment & Plan Note (Signed)
Bilateral heel lift, aggressive formal physical therapy for this as well.

## 2017-10-28 NOTE — Progress Notes (Signed)
Subjective:    I'm seeing this patient as a consultation for: Dr. Sunnie NielsenNatalie Chase  CC: Left knee pain, left heel pain  HPI: Left knee pain: Was getting up out of bed about a week ago, felt a pop, then had swelling, pain at the medial joint line, inability to fully flex or fully extend the knee.  This is improved slightly, he can fully extend the knee but still has some pain and tightness with terminal flexion.  No overt mechanical symptoms, minimal buckling.  Left heel pain: Localized at the Achilles insertion, minimally erythematous, moderately tender.  Localized without radiation.  I reviewed the past medical history, family history, social history, surgical history, and allergies today and no changes were needed.  Please see the problem list section below in epic for further details.  Past Medical History: Past Medical History:  Diagnosis Date  . AAA (abdominal aortic aneurysm) (HCC) 10/28/2017  . Abnormal EKG 10/28/2017  . Coronary artery disease involving coronary bypass graft of native heart with angina pectoris (HCC) 05/20/2017  . Dyslipidemia 05/28/2017  . Essential hypertension 07/06/2015  . Heart attack (HCC)   . Hypertension   . Status post coronary artery stent placement 05/28/2017   Past Surgical History: Past Surgical History:  Procedure Laterality Date  . CARDIAC SURGERY     Social History: Social History   Socioeconomic History  . Marital status: Married    Spouse name: Ruthy Dickamela Shuler  . Number of children: 3  . Years of education: None  . Highest education level: Associate degree: occupational, Scientist, product/process developmenttechnical, or vocational program  Social Needs  . Financial resource strain: None  . Food insecurity - worry: None  . Food insecurity - inability: None  . Transportation needs - medical: None  . Transportation needs - non-medical: None  Occupational History  . Occupation: Sport and exercise psychologistlectrician     Employer: [employer]   Tobacco Use  . Smoking status: Former Smoker      Types: Cigarettes    Last attempt to quit: 07/05/2005    Years since quitting: 12.3  . Smokeless tobacco: Never Used  Substance and Sexual Activity  . Alcohol use: No  . Drug use: No  . Sexual activity: Not Currently    Partners: Female    Birth control/protection: None  Other Topics Concern  . None  Social History Narrative  . None   Family History: Family History  Problem Relation Age of Onset  . Congestive Heart Failure Mother   . Heart attack Mother   . Cancer Father        Lung and stomach CA  . Diabetes Brother        2 brothers  . Heart attack Brother    Allergies: Allergies  Allergen Reactions  . Morphine Hives  . Hydrocodone-Acetaminophen Nausea And Vomiting  . Percocet [Oxycodone-Acetaminophen] Nausea And Vomiting  . Vicodin [Hydrocodone-Acetaminophen] Nausea And Vomiting   Medications: See med rec.  Review of Systems: No headache, visual changes, nausea, vomiting, diarrhea, constipation, dizziness, abdominal pain, skin rash, fevers, chills, night sweats, weight loss, swollen lymph nodes, body aches, joint swelling, muscle aches, chest pain, shortness of breath, mood changes, visual or auditory hallucinations.   Objective:   General: Well Developed, well nourished, and in no acute distress.  Neuro:  Extra-ocular muscles intact, able to move all 4 extremities, sensation grossly intact.  Deep tendon reflexes tested were normal. Psych: Alert and oriented, mood congruent with affect. ENT:  Ears and nose appear unremarkable.  Hearing grossly normal. Neck: Unremarkable overall appearance, trachea midline.  No visible thyroid enlargement. Eyes: Conjunctivae and lids appear unremarkable.  Pupils equal and round. Skin: Warm and dry, no rashes noted.  Cardiovascular: Pulses palpable, no extremity edema. Left knee: Moderate effusion, tenderness at the medial joint line ROM ok in flexion and extension and lower leg rotation, significant pain with flexion past 90  degrees. Ligaments with solid consistent endpoints including ACL, PCL, LCL, MCL. Negative Mcmurray's and provocative meniscal tests. Non painful patellar compression. Patellar and quadriceps tendons unremarkable. Hamstring and quadriceps strength is normal. Left foot: No visible erythema or swelling. Range of motion is full in all directions. Strength is 5/5 in all directions. No hallux valgus. No pes cavus or pes planus. No abnormal callus noted. No pain over the navicular prominence, or base of fifth metatarsal. No tenderness to palpation of the calcaneal insertion of plantar fascia. Severe tenderness at the Achilles insertion, minimal erythema No pain over the calcaneal bursa. No pain of the retrocalcaneal bursa. No tenderness to palpation over the tarsals, metatarsals, or phalanges. No hallux rigidus or limitus. No tenderness palpation over interphalangeal joints. No pain with compression of the metatarsal heads. Neurovascularly intact distally.  Procedure: Real-time Ultrasound Guided Injection of left knee Device: GE Logiq E  Verbal informed consent obtained.  Time-out conducted.  Noted no overlying erythema, induration, or other signs of local infection.  Skin prepped in a sterile fashion.  Local anesthesia: Topical Ethyl chloride.  With sterile technique and under real time ultrasound guidance: 1 cc kenalog 40, 2 cc lidocaine, 2 cc bupivacaine injected easily Completed without difficulty  Pain immediately resolved suggesting accurate placement of the medication.  Advised to call if fevers/chills, erythema, induration, drainage, or persistent bleeding.  Images permanently stored and available for review in the ultrasound unit.  Impression: Technically successful ultrasound guided injection.  Impression and Recommendations:   This case required medical decision making of moderate complexity.  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 Injection as above, visible therapy, return to see me in 1 month.   MR for operative planning if no better.  Left insertional Achilles tendinitis Bilateral heel lift, aggressive formal physical therapy for this as well.  __________________________________________ Ihor Austin. [name], M.D., ABFM., CAQSM. Primary Care and Sports Medicine [facility]  Adjunct Instructor of Family Medicine  [employer]

## 2017-10-28 NOTE — Assessment & Plan Note (Signed)
Suspect medial meniscal tear, there is likely low-grade osteoarthritis. Injection as above, visible therapy, return to see me in 1 month.   MR for operative planning if no better.

## 2017-10-28 NOTE — Patient Instructions (Signed)
   Will get some records - sounds like we have a bit of a mess on our hands!  Will get Dr. Karie Schwalbe to have a look at the knee today.   Continue current medications and cardiology follow-up.   Let's have another visit in a month or so - hopefully by then I'll have some records from your previous primary, your orthopedic doctor and your lung doctor.

## 2017-10-30 ENCOUNTER — Telehealth: Payer: Self-pay | Admitting: Osteopathic Medicine

## 2017-10-30 DIAGNOSIS — R9389 Abnormal findings on diagnostic imaging of other specified body structures: Secondary | ICD-10-CM

## 2017-10-30 DIAGNOSIS — Z136 Encounter for screening for cardiovascular disorders: Secondary | ICD-10-CM | POA: Insufficient documentation

## 2017-10-30 DIAGNOSIS — M542 Cervicalgia: Secondary | ICD-10-CM

## 2017-10-30 DIAGNOSIS — M7731 Calcaneal spur, right foot: Secondary | ICD-10-CM | POA: Insufficient documentation

## 2017-10-30 NOTE — Telephone Encounter (Signed)
[facility] Records were reviewed and problem list updated.  Significantly, he will require repeat CT scan of the chest to monitor lung nodule, will be due in September 2019. Abdominal aortic aneurysm screening was indeterminate given poor visualization, consider further imaging.

## 2017-11-02 ENCOUNTER — Encounter: Payer: Self-pay | Admitting: Osteopathic Medicine

## 2017-11-02 LAB — TESTOSTERONE
Folate: 10.59
MAGNESIUM: 2
TESTOSTERONE: 2.44
VIT D 25 HYDROXY: 19.37
VITAMIN B12: 443

## 2017-11-02 LAB — PSA
ALBUMIN: 4.4
Anion gap: 11
CHLORIDE: 102
CO2: 24
EGFR (Non-African Amer.): 64.3
Folate: 10.56
Magnesium: 2.1
RPR: NONREACTIVE
Total Protein: 7.2 g/dL

## 2017-11-05 ENCOUNTER — Encounter: Payer: Self-pay | Admitting: Physical Therapy

## 2017-11-05 ENCOUNTER — Ambulatory Visit: Payer: Medicare Other | Admitting: Physical Therapy

## 2017-11-05 DIAGNOSIS — M25562 Pain in left knee: Secondary | ICD-10-CM

## 2017-11-05 DIAGNOSIS — M25572 Pain in left ankle and joints of left foot: Secondary | ICD-10-CM

## 2017-11-05 DIAGNOSIS — M6281 Muscle weakness (generalized): Secondary | ICD-10-CM | POA: Diagnosis not present

## 2017-11-05 NOTE — Therapy (Signed)
[facility] [place] [address] [place], [place], [address] Phone: [phone]   Fax:  [phone]  Physical Therapy Evaluation  Patient Details  Name: Jerome Chase MRN: [id number] Date of Birth: 1950/12/11 Referring Provider: Dr [name]   Encounter Date: 11/05/2017  PT End of Session - 11/05/17 1015    Visit Number  1    Number of Visits  12    Date for PT Re-Evaluation  12/17/17    PT Start Time  1015    PT Stop Time  1058    PT Time Calculation (min)  43 min       Past Medical History:  Diagnosis Date  . AAA (abdominal aortic aneurysm) (HCC) 10/28/2017  . Abnormal EKG 10/28/2017  . Coronary artery disease involving coronary bypass graft of native heart with angina pectoris (HCC) 05/20/2017  . Dyslipidemia 05/28/2017  . Essential hypertension 07/06/2015  . Heart attack (HCC)   . Hypertension   . Status post coronary artery stent placement 05/28/2017    Past Surgical History:  Procedure Laterality Date  . CARDIAC SURGERY      There were no vitals filed for this visit.   Subjective Assessment - 11/05/17 1015    Subjective  Jerome Chase reports he woke about 3 wks ago with knee pain and his achilles started about a month ago.  He is using a heel lifts and had an injection in the Lt knee.  It helped orignially however the pain is strating to return.          [facility] PT Assessment - 11/05/17 0001      Assessment   Medical Diagnosis  Lt knee pain and Rt achilles tendonitis    Referring Provider  Dr [name]    Onset Date/Surgical Date  10/08/17    Hand Dominance  Right    Next MD Visit  PRN    Prior Therapy  none      Precautions   Precautions  None    Precaution Comments  heel lifts      Balance Screen   Has the patient fallen in the past 6 months  Yes    How many times?  1 off ladder about 6 months ago    Has the patient had a decrease in activity level because of a fear of falling?   No    Is the  patient reluctant to leave their home because of a fear of falling?   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     Leisure  work      Observation/Other Assessments   Focus on Therapeutic Outcomes (FOTO)   47% limited      Functional Tests   Functional tests  Squat;Single leg stance      Squat   Comments  poor DF Lt , shift to Rt , poor eccentric  control      Single Leg Stance   Comments  Rt WNL, Lt WNL      Posture/Postural Control   Posture/Postural Control  No significant limitations      ROM / Strength   AROM / PROM /  Strength  AROM;Strength      AROM   AROM Assessment Site  Ankle;Knee    Right/Left Knee  -- full extension bilat, flex Rt 137, Lt 135    Right/Left Ankle  Left;Right    Right Ankle Dorsiflexion  2    Right Ankle Plantar Flexion  -- WNL    Right Ankle Inversion  32    Right Ankle Eversion  30    Left Ankle Dorsiflexion  5    Left Ankle Plantar Flexion  -- WNL    Left Ankle Inversion  28    Left Ankle Eversion  18      Strength   Strength Assessment Site  Hip;Knee;Ankle    Right/Left Hip  Left Rt extension 4+/5, others WNL    Left Hip Flexion  5/5    Left Hip Extension  4+/5    Left Hip ABduction  4/5    Right/Left Knee  -- WNL, fair quad contraction Lt with quad sets    Right/Left Ankle  -- Rt WNL    Left Ankle Dorsiflexion  5/5    Left Ankle Plantar Flexion  4-/5    Left Ankle Inversion  5/5    Left Ankle Eversion  4-/5      Flexibility   Soft Tissue Assessment /Muscle Length  yes    Hamstrings  supine SLR Lt 48, Rt 62    Quadriceps  prone knee flex Lt 130, Rt 125             Objective measurements completed on examination: See above findings.      [facility] Adult  PT Treatment/Exercise - 11/05/17 0001      Exercises   Exercises  Other Exercises    Other Exercises   performed HEP per handout with VC for form.              PT Education - 11/05/17 1057    Education provided  Yes    Education Details  HEP    Person(s) Educated  Patient    Methods  Explanation;Handout;Demonstration    Comprehension  Verbalized understanding;Returned demonstration          PT Long Term Goals - 11/05/17 1105      PT LONG TERM GOAL #1   Title  I with advanced HEP ( 12/17/17)     Time  6    Period  Weeks    Status  New      PT LONG TERM GOAL #2   Title  perform squat with good form and no pain to allow him to perform parts of his job ( 12/17/17)     Time  6    Period  Weeks    Status  New      PT LONG TERM GOAL #3   Title  increase bilat LE strength =/> 5-/5 ( 12/17/17)     Time  6    Period  Weeks    Status  New      PT LONG TERM GOAL #4   Title  increase bilat dorsiflexion =/> 12 degrees to allow for normalized gait ( 12/17/17)     Time  6    Period  Weeks    Status  New      PT LONG TERM GOAL #5   Title  report decreased pain =/> 75% with walking and work ( 12/17/17)     Time  6    Period  Weeks    Status  New      Additional Long Term Goals   Additional Long Term Goals  Yes      PT LONG TERM GOAL #6   Title  improve FOTO =/< 39% limited ( 12/17/17)     Time  6    Period  Weeks    Status  New             Plan - 11/05/17 1807    Clinical Impression Statement  67 yo male presents with Lt knee and Rt achilles pain. He has very limited dorsiflexion bilat, LE weakness, some tenderness in the Rt achilles and mid foot along with a lot of LE tightness.     Clinical Presentation  Stable    Clinical Decision Making  Low    Rehab Potential  Good    PT Frequency  2x / week    PT Duration  6 weeks    PT Treatment/Interventions  Iontophoresis 4mg /ml Dexamethasone;Neuromuscular re-education;Dry needling;Manual techniques;Moist  Heat;Patient/family education;Ultrasound;Cryotherapy;Microbiologistlectrical Stimulation;Balance training;Therapeutic exercise;Vasopneumatic Device;Passive range of motion;Taping    PT Next Visit Plan  modalities for pain PRN, LE strengthening, mobs for ankle DF.    Consulted and Agree with Plan of Care  Patient       Patient will benefit from skilled therapeutic intervention in order to improve the following deficits and impairments:  Abnormal gait, Pain, Increased muscle spasms, Decreased strength, Decreased range of motion, Difficulty walking  Visit Diagnosis: Acute pain of left knee - Plan: PT plan of care cert/re-cert  Pain in left ankle and joints of left foot - Plan: PT plan of care cert/re-cert  Muscle weakness (generalized) - Plan: PT plan of care cert/re-cert     Problem List Patient Active Problem List   Diagnosis Date Noted  . Calcaneal spur of foot, right 10/30/2017  . Neck pain 10/30/2017  . Screening for AAA (abdominal aortic aneurysm) 10/30/2017  . AAA (abdominal aortic aneurysm) (HCC) 10/28/2017  . Abnormal EKG 10/28/2017  . Chest pain 10/28/2017  . Left knee pain 10/28/2017  . Left insertional Achilles tendinitis 10/28/2017  . 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 Tailor's bunion of both feet 09/20/2017  . Ingrown nail 09/18/2017  . Tenosynovitis of right foot 09/18/2017  . Chronic heel pain, right 09/18/2017  . Dyslipidemia 05/28/2017  . Status post coronary artery stent placement 05/28/2017  . Abnormal chest CT 05/28/2017  . Coronary artery disease involving coronary bypass graft of native heart with angina pectoris (HCC) 05/20/2017  . Insomnia 07/16/2015  . Essential hypertension 07/06/2015  . Dyspnea on effort 07/06/2015    Roderic ScarceSusan Bart Ashford PT  11/05/2017, 6:14 PM  [facility] [address] [place] [address] [place], [place], [address] Phone: [phone]   Fax:  [phone]  Name: Jerome Chase MRN:  [id number] Date of Birth: 20-Sep-1950

## 2017-11-05 NOTE — Patient Instructions (Addendum)
Quads / HF, Prone    Lie face down, knees together. Grasp one ankle with same-side hand. Use towel if needed to reach. Gently pull foot toward buttock. Hold _45__ seconds. Repeat _2__ times per session. Do _2__ sessions per day. Both sides  Supine: Leg Stretch With Strap (Advanced)    Lie on back with one knee bent, foot flat on floor. Hook strap around other foot. Straighten knee. Raise leg to maximal stretch and straighten knee further by tightening quadriceps muscle. Hold _45__ seconds. Warning: Intense stretch. Stay within tolerance. Repeat _2__ times per session. Do _1__ sessions per day. Repeat on other side.    Gastroc Stretch    Stand with right foot back, leg straight, forward leg bent. Keeping heel on floor, turned slightly out, lean into wall until stretch is felt in calf. Hold _45___ seconds. Repeat __2__ times per set. Do _1___ sets per session. Do _1___ sessions per day. Repeat on other side  Quad Set    With left leg bent, foot flat, slowly tighten muscles on thigh of straight leg while counting out loud to __10__. Repeat __10__ times. Do ___1_ sessions per day.   Ice the achilles tendon and heat the knee, for ~15 minutes a day.

## 2017-11-11 ENCOUNTER — Ambulatory Visit (INDEPENDENT_AMBULATORY_CARE_PROVIDER_SITE_OTHER): Payer: Medicare Other | Admitting: Physical Therapy

## 2017-11-11 ENCOUNTER — Encounter: Payer: Medicare Other | Admitting: Physical Therapy

## 2017-11-11 DIAGNOSIS — M6281 Muscle weakness (generalized): Secondary | ICD-10-CM | POA: Diagnosis not present

## 2017-11-11 DIAGNOSIS — M25562 Pain in left knee: Secondary | ICD-10-CM

## 2017-11-11 DIAGNOSIS — M25571 Pain in right ankle and joints of right foot: Secondary | ICD-10-CM

## 2017-11-11 NOTE — Patient Instructions (Signed)

## 2017-11-11 NOTE — Therapy (Signed)
[facility] [place] [address] [place] [address] [place], [place], [address] Phone: [phone]   Fax:  [phone]  Physical Therapy Treatment  Patient Details  Name: Jerome Chase MRN: [id number] Date of Birth: September 13, 1950 Referring Provider: Dr. [name]   Encounter Date: 11/11/2017  PT End of Session - 11/11/17 0938    Visit Number  2    Number of Visits  12    Date for PT Re-Evaluation  12/17/17    PT Start Time  0935    PT Stop Time  1016    PT Time Calculation (min)  41 min       Past Medical History:  Diagnosis Date  . AAA (abdominal aortic aneurysm) (HCC) 10/28/2017  . Abnormal EKG 10/28/2017  . Coronary artery disease involving coronary bypass graft of native heart with angina pectoris (HCC) 05/20/2017  . Dyslipidemia 05/28/2017  . Essential hypertension 07/06/2015  . Heart attack (HCC)   . Hypertension   . Status post coronary artery stent placement 05/28/2017    Past Surgical History:  Procedure Laterality Date  . CARDIAC SURGERY      There were no vitals filed for this visit.  Subjective Assessment - 11/11/17 0938    Subjective  "I cried after I left here last time, I was in so much pain".  Pt reports things have calmed down a little more since then. He has done the HEP a couple of times since last visit. He believes his Achilles is not as swollen as it was.  The orthotics have helped.     Currently in Pain?  Yes    Pain Score  4     Pain Location  Knee    Pain Orientation  Left    Pain Descriptors / Indicators  Dull    Aggravating Factors   turning foot in.     Pain Relieving Factors  medicine    Multiple Pain Sites  Yes    Pain Score  4    Pain Location  Ankle    Pain Orientation  Right    Pain Descriptors / Indicators  Sharp;Sore    Aggravating Factors   walking     Pain Relieving Factors  flexing foot in supine          [facility] PT Assessment - 11/11/17 0001      Assessment   Medical Diagnosis  Lt knee  pain and Rt achilles tendonitis    Referring Provider  Dr. [name]    Onset Date/Surgical Date  10/08/17    Hand Dominance  Right    Next MD Visit  PRN    Prior Therapy  none       [facility] Adult PT Treatment/Exercise - 11/11/17 0001      Knee/Hip Exercises: Stretches   Passive Hamstring Stretch  Right;Left;2 reps;30 seconds seated, straight back.     Quad Stretch  Right;Left;2 reps;30 seconds foot under chair    Piriformis Stretch  Right;Left;3 reps;30 seconds trial sitting; unable- moved to supine   VC for counting and positioning.     Knee/Hip Exercises: Aerobic   Nustep  L5: 6 min  - PTA present to discuss progress.     Modalities   Modalities  Iontophoresis;Cryotherapy      Cryotherapy   Number Minutes Cryotherapy  -- pt declined      Iontophoresis   Type of Iontophoresis  Dexamethasone    Location  Rt medial calcaneus    Dose  1.0 cc  Time  80 mA; 6 hr patch      Ankle Exercises: Stretches   Plantar Fascia Stretch  2 reps;30 seconds each foot    Soleus Stretch  2 reps;30 seconds bilat    Gastroc Stretch  3 reps;30 seconds runners stretch then, heels off of step      Ankle Exercises: Seated   Ankle Circles/Pumps  AROM;Right;10 reps             PT Education - 11/11/17 1037    Education provided  Yes    Education Details  HEP, ionto info    Person(s) Educated  Patient    Methods  Explanation;Demonstration;Verbal cues;Tactile cues    Comprehension  Verbalized understanding;Returned demonstration          PT Long Term Goals - 11/05/17 1105      PT LONG TERM GOAL #1   Title  I with advanced HEP ( 12/17/17)     Time  6    Period  Weeks    Status  New      PT LONG TERM GOAL #2   Title  perform squat with good form and no pain to allow him to perform parts of his job ( 12/17/17)     Time  6    Period  Weeks    Status  New      PT LONG TERM GOAL #3   Title  increase bilat LE strength =/> 5-/5 ( 12/17/17)     Time  6    Period  Weeks    Status   New      PT LONG TERM GOAL #4   Title  increase bilat dorsiflexion =/> 12 degrees to allow for normalized gait ( 12/17/17)     Time  6    Period  Weeks    Status  New      PT LONG TERM GOAL #5   Title  report decreased pain =/> 75% with walking and work ( 12/17/17)     Time  6    Period  Weeks    Status  New      Additional Long Term Goals   Additional Long Term Goals  Yes      PT LONG TERM GOAL #6   Title  improve FOTO =/< 39% limited ( 12/17/17)     Time  6    Period  Weeks    Status  New            Plan - 11/11/17 1026    Clinical Impression Statement  Pt continues with LE tightness bilat.  Pt educated on alternate positions for LE stretch to improve compliance with HEP.  Trial of ionto initiated for Rt medial Achilles.  Pt reported no increase in pain / symptoms during or after session.     Rehab Potential  Good    PT Frequency  2x / week    PT Duration  6 weeks    PT Treatment/Interventions  Iontophoresis 4mg /ml Dexamethasone;Neuromuscular re-education;Dry needling;Manual techniques;Moist Heat;Patient/family education;Ultrasound;Cryotherapy;Microbiologistlectrical Stimulation;Balance training;Therapeutic exercise;Vasopneumatic Device;Passive range of motion;Taping    PT Next Visit Plan  continue LE stretches and strengthening; assess response to ionto; trial of [place].     Consulted and Agree with Plan of Care  Patient       Patient will benefit from skilled therapeutic intervention in order to improve the following deficits and impairments:  Abnormal gait, Pain, Increased muscle spasms, Decreased strength, Decreased range of motion, Difficulty walking  Visit Diagnosis: Acute pain of left knee  Muscle weakness (generalized)     Problem List Patient Active Problem List   Diagnosis Date Noted  . Calcaneal spur of foot, right 10/30/2017  . Neck pain 10/30/2017  . Screening for AAA (abdominal aortic aneurysm) 10/30/2017  . AAA (abdominal aortic aneurysm) (HCC) 10/28/2017  .  Abnormal EKG 10/28/2017  . Chest pain 10/28/2017  . Left knee pain 10/28/2017  . Left insertional Achilles tendinitis 10/28/2017  . 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 Tailor's bunion of both feet 09/20/2017  . Ingrown nail 09/18/2017  . Tenosynovitis of right foot 09/18/2017  . Chronic heel pain, right 09/18/2017  . Dyslipidemia 05/28/2017  . Status post coronary artery stent placement 05/28/2017  . Abnormal chest CT 05/28/2017  . Coronary artery disease involving coronary bypass graft of native heart with angina pectoris (HCC) 05/20/2017  . Insomnia 07/16/2015  . Essential hypertension 07/06/2015  . Dyspnea on effort 07/06/2015   Mayer Camel, PTA 11/11/17 10:38 AM  [facility] [address] [place] [address] [place], [place], [address] Phone: [phone]   Fax:  [phone]  Name: Jerome Chase MRN: [id number] Date of Birth: March 21, 1951

## 2017-11-13 ENCOUNTER — Ambulatory Visit (INDEPENDENT_AMBULATORY_CARE_PROVIDER_SITE_OTHER): Payer: Medicare Other | Admitting: Physical Therapy

## 2017-11-13 DIAGNOSIS — M25572 Pain in left ankle and joints of left foot: Secondary | ICD-10-CM | POA: Diagnosis not present

## 2017-11-13 DIAGNOSIS — M6281 Muscle weakness (generalized): Secondary | ICD-10-CM

## 2017-11-13 DIAGNOSIS — M25562 Pain in left knee: Secondary | ICD-10-CM | POA: Diagnosis not present

## 2017-11-13 DIAGNOSIS — M25571 Pain in right ankle and joints of right foot: Secondary | ICD-10-CM | POA: Diagnosis not present

## 2017-11-13 NOTE — Therapy (Signed)
[facility] [place] [address] [place] [address] [place], [place], [address] Phone: [phone]   Fax:  [phone]  Physical Therapy Treatment  Patient Details  Name: Jerome Chase MRN: [id number] Date of Birth: March 21, 1951 Referring Provider: Dr. [name]   Encounter Date: 11/13/2017  PT End of Session - 11/13/17 1030    Visit Number  3    Number of Visits  12    Date for PT Re-Evaluation  12/17/17    PT Start Time  1029 pt arrived late    PT Stop Time  1106    PT Time Calculation (min)  37 min    Activity Tolerance  Patient tolerated treatment well       Past Medical History:  Diagnosis Date  . AAA (abdominal aortic aneurysm) (HCC) 10/28/2017  . Abnormal EKG 10/28/2017  . Coronary artery disease involving coronary bypass graft of native heart with angina pectoris (HCC) 05/20/2017  . Dyslipidemia 05/28/2017  . Essential hypertension 07/06/2015  . Heart attack (HCC)   . Hypertension   . Status post coronary artery stent placement 05/28/2017    Past Surgical History:  Procedure Laterality Date  . CARDIAC SURGERY      There were no vitals filed for this visit.  Subjective Assessment - 11/13/17 1033    Subjective  Pt reports he tried to step up on ladder with Lt leg; leg buckled and he fell.  He reports he was able to get his shoes on a little easier today.  He believes the Rt Achilles is a little better, but Lt knee is the same.     Currently in Pain?  Yes    Pain Score  5     Pain Location  Knee    Pain Orientation  Left    Pain Descriptors / Indicators  Aching    Aggravating Factors   turning foot in     Pain Relieving Factors  ?    Pain Score  3    Pain Location  Ankle    Pain Orientation  Right    Pain Descriptors / Indicators  Sore    Aggravating Factors   walking    Pain Relieving Factors  flexing foot in bed          [facility] PT Assessment - 11/13/17 0001      Assessment   Medical Diagnosis  Lt knee pain and Rt  achilles tendonitis    Referring Provider  Dr. [name]    Onset Date/Surgical Date  10/08/17    Hand Dominance  Right    Next MD Visit  PRN      AROM   Right/Left Ankle  Right    Right Ankle Dorsiflexion  10      Flexibility   Quadriceps  bilat ~114 deg (prone stretch)       [facility] Adult PT Treatment/Exercise - 11/13/17 0001      Knee/Hip Exercises: Stretches   Passive Hamstring Stretch  Right;Left;2 reps;30 seconds seated, straight back.     Quad Stretch  Right;Left;2 reps;30 seconds prone with strap    Piriformis Stretch  Right;Left;3 reps;30 seconds supine      Knee/Hip Exercises: Supine   Straight Leg Raises  Left;1 set;10 reps      Knee/Hip Exercises: Sidelying   Hip ABduction  Left;1 set;10 reps      Modalities   Modalities  Iontophoresis;Ultrasound      Ultrasound   Ultrasound Location  Rt Achilles (distal, medial portion)  Ultrasound Parameters  50%, 1.1 w/cm2, 3.3 mHz, 8 min     Ultrasound Goals  Pain;Edema      Iontophoresis   Type of Iontophoresis  Dexamethasone    Location  Rt medial calcaneus    Dose  1.0 cc    Time  80 mA; 6 hr patch      Manual Therapy   Manual Therapy  Taping    Manual therapy comments  [place] tape applied to Lt medial knee with 50% stretch in X pattern to decompress tissue, decrease pain, increase proprioception.              PT Education - 11/13/17 1340    Education provided  Yes    Education Details  Rock tape rationale and safe removal technique    Person(s) Educated  Patient    Methods  Explanation pt declined handout    Comprehension  Verbalized understanding          PT Long Term Goals - 11/13/17 1342      PT LONG TERM GOAL #1   Title  I with advanced HEP ( 12/17/17)     Time  6    Period  Weeks    Status  On-going      PT LONG TERM GOAL #2   Title  perform squat with good form and no pain to allow him to perform parts of his job ( 12/17/17)     Time  6    Period  Weeks    Status  On-going       PT LONG TERM GOAL #3   Title  increase bilat LE strength =/> 5-/5 ( 12/17/17)     Time  6    Period  Weeks    Status  On-going      PT LONG TERM GOAL #4   Title  increase bilat dorsiflexion =/> 12 degrees to allow for normalized gait ( 12/17/17)     Time  6    Period  Weeks    Status  On-going      PT LONG TERM GOAL #5   Title  report decreased pain =/> 75% with walking and work ( 12/17/17)     Time  6    Period  Weeks    Status  On-going      PT LONG TERM GOAL #6   Title  improve FOTO =/< 39% limited ( 12/17/17)     Time  6    Period  Weeks    Status  On-going            Plan - 11/13/17 1040    Clinical Impression Statement  Pt had positive response with LE stretches and ionto patch.  Repeated today, with addition of [place].  Added LE strengthening for LLE to HEP.  Progressing towards goals.    Rehab Potential  Good    PT Frequency  2x / week    PT Duration  6 weeks    PT Treatment/Interventions  Iontophoresis 4mg /ml Dexamethasone;Neuromuscular re-education;Dry needling;Manual techniques;Moist Heat;Patient/family education;Ultrasound;Cryotherapy;Microbiologist;Therapeutic exercise;Vasopneumatic Device;Passive range of motion;Taping    PT Next Visit Plan  continue LE stretches and strengthening; assess response to ionto; trial of [place].     Consulted and Agree with Plan of Care  Patient       Patient will benefit from skilled therapeutic intervention in order to improve the following deficits and impairments:  Abnormal gait, Pain, Increased muscle spasms, Decreased strength, Decreased range of  motion, Difficulty walking  Visit Diagnosis: Acute pain of left knee  Muscle weakness (generalized)  Pain in right ankle and joints of right foot  Pain in left ankle and joints of left foot     Problem List Patient Active Problem List   Diagnosis Date Noted  . Calcaneal spur of foot, right 10/30/2017  . Neck pain 10/30/2017  . Screening for AAA  (abdominal aortic aneurysm) 10/30/2017  . AAA (abdominal aortic aneurysm) (HCC) 10/28/2017  . Abnormal EKG 10/28/2017  . Chest pain 10/28/2017  . Left knee pain 10/28/2017  . Left insertional Achilles tendinitis 10/28/2017  . 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 Tailor's bunion of both feet 09/20/2017  . Ingrown nail 09/18/2017  . Tenosynovitis of right foot 09/18/2017  . Chronic heel pain, right 09/18/2017  . Dyslipidemia 05/28/2017  . Status post coronary artery stent placement 05/28/2017  . Abnormal chest CT 05/28/2017  . Coronary artery disease involving coronary bypass graft of native heart with angina pectoris (HCC) 05/20/2017  . Insomnia 07/16/2015  . Essential hypertension 07/06/2015  . Dyspnea on effort 07/06/2015   Mayer CamelJennifer Carlson-Long, PTA 11/13/17 1:45 PM  [facility] [address] [place], [place], [address] Phone: [phone]   Fax:  [phone]  Name: Jerome Chase MRN: [id number] Date of Birth: [birth year]

## 2017-11-19 ENCOUNTER — Encounter: Payer: Medicare Other | Admitting: Physical Therapy

## 2017-11-20 ENCOUNTER — Encounter: Payer: Medicare Other | Admitting: Physical Therapy

## 2017-11-24 ENCOUNTER — Ambulatory Visit (INDEPENDENT_AMBULATORY_CARE_PROVIDER_SITE_OTHER): Payer: Medicare Other | Admitting: Physical Therapy

## 2017-11-24 DIAGNOSIS — M6281 Muscle weakness (generalized): Secondary | ICD-10-CM | POA: Diagnosis not present

## 2017-11-24 DIAGNOSIS — M25571 Pain in right ankle and joints of right foot: Secondary | ICD-10-CM | POA: Diagnosis not present

## 2017-11-24 DIAGNOSIS — M25562 Pain in left knee: Secondary | ICD-10-CM | POA: Diagnosis present

## 2017-11-24 NOTE — Therapy (Addendum)
[facility] [place] [place], [place], [address] Phone: [phone]   Fax:  [phone]  Physical Therapy Treatment  Patient Details  Name: Jerome Chase MRN: [id number] Date of Birth: 07-30-1951 Referring Provider: Dr. Dianah Field   Encounter Date: 11/24/2017  PT End of Session - 11/24/17 1021    Visit Number  4    Number of Visits  12    Date for PT Re-Evaluation  12/17/17    PT Start Time  1018    PT Stop Time  1056    PT Time Calculation (min)  38 min    Activity Tolerance  Patient tolerated treatment well    Behavior During Therapy  [facility] for tasks assessed/performed       Past Medical History:  Diagnosis Date  . AAA (abdominal aortic aneurysm) ([place]) 10/28/2017  . Abnormal EKG 10/28/2017  . Coronary artery disease involving coronary bypass graft of native heart with angina pectoris ([place]) 05/20/2017  . Dyslipidemia 05/28/2017  . Essential hypertension 07/06/2015  . Heart attack ([place])   . Hypertension   . Status post coronary artery stent placement 05/28/2017    Past Surgical History:  Procedure Laterality Date  . CARDIAC SURGERY      There were no vitals filed for this visit.  Subjective Assessment - 11/24/17 1022    Subjective  Pt reports his Lt knee and Rt Achilles continue to hurt.  The Rt Achilles hurts worse as the day goes on.  He reports he has "a little" bit of improvement since starting therapy.  He is now able to tie Rt shoe.     Currently in Pain?  Yes    Pain Score  3     Pain Orientation  Left    Pain Descriptors / Indicators  Aching    Pain Score  3    Pain Location  Ankle    Pain Orientation  Right    Pain Descriptors / Indicators  Tender;Sharp    Aggravating Factors   walking    Pain Relieving Factors  flexing foot in the bed, rest.          [facility] PT Assessment - 11/24/17 0001      Assessment   Medical Diagnosis  Lt knee pain and Rt achilles tendonitis    Referring Provider   Dr. Dianah Field    Onset Date/Surgical Date  10/08/17    Hand Dominance  Right    Next MD Visit  PRN        [facility] Adult PT Treatment/Exercise - 11/24/17 0001      Self-Care   Self-Care  Other Self-Care Comments    Other Self-Care Comments   Pt educated on self massage to LE with roller stick to decrease fascial tightness.  Pt verbalized understanding and returned demo with cues.       Knee/Hip Exercises: Stretches   Passive Hamstring Stretch  Right;Left;2 reps;30 seconds seated, straight back.     Quad Stretch  Right;Left;30 seconds;3 reps foot under chair    Piriformis Stretch  Right;Left;3 reps;30 seconds seated    Other Knee/Hip Stretches  standing adductor stretch (lunge with UE support on counter) x 30 sec x 3 reps each leg.       Knee/Hip Exercises: Aerobic   Nustep  L5: 6 min       Cryotherapy   Number Minutes Cryotherapy  5 Minutes    Cryotherapy Location  Ankle Medial Rt calcaneus    Type of  Cryotherapy  Ice massage      Iontophoresis   Location  Rt medial calcaneus; Lt medial knee     Dose  1.0 cc at each area    Time  120 mA; 12 hr patch                  PT Long Term Goals - 11/13/17 1342      PT LONG TERM GOAL #1   Title  I with advanced HEP ( 12/17/17)     Time  6    Period  Weeks    Status  On-going      PT LONG TERM GOAL #2   Title  perform squat with good form and no pain to allow him to perform parts of his job ( 12/17/17)     Time  6    Period  Weeks    Status  On-going      PT LONG TERM GOAL #3   Title  increase bilat LE strength =/> 5-/5 ( 12/17/17)     Time  6    Period  Weeks    Status  On-going      PT LONG TERM GOAL #4   Title  increase bilat dorsiflexion =/> 12 degrees to allow for normalized gait ( 12/17/17)     Time  6    Period  Weeks    Status  On-going      PT LONG TERM GOAL #5   Title  report decreased pain =/> 75% with walking and work ( 12/17/17)     Time  6    Period  Weeks    Status  On-going      PT LONG  TERM GOAL #6   Title  improve FOTO =/< 39% limited ( 12/17/17)     Time  6    Period  Weeks    Status  On-going            Plan - 11/24/17 1149    Clinical Impression Statement  Pt reported slight decrease in Lt knee pain with Rock tape application.   He is reporting 25% improvement in Lt knee pain since initiating therapy.  Pt reporting improved mobility since performing stretches daily, ie: able to put shoe on now with less difficulty.  Added self massage with roller stick and additional stretches to HEP to continue work on fascial tightness.  PRogressing gradually towards goals.     Rehab Potential  Good    PT Frequency  2x / week    PT Duration  6 weeks    PT Treatment/Interventions  Iontophoresis 42m/ml Dexamethasone;Neuromuscular re-education;Dry needling;Manual techniques;Moist Heat;Patient/family education;Ultrasound;Cryotherapy;ELobbyistTherapeutic exercise;Vasopneumatic Device;Passive range of motion;Taping    PT Next Visit Plan  continue LE stretches and strengthening.  Measure LE strength and ankle ROM.   Consulted and Agree with Plan of Care  Patient       Patient will benefit from skilled therapeutic intervention in order to improve the following deficits and impairments:  Abnormal gait, Pain, Increased muscle spasms, Decreased strength, Decreased range of motion, Difficulty walking  Visit Diagnosis: Acute pain of left knee  Muscle weakness (generalized)  Pain in right ankle and joints of right foot     Problem List Patient Active Problem List   Diagnosis Date Noted  . Calcaneal spur of foot, right 10/30/2017  . Neck pain 10/30/2017  . Screening for AAA (abdominal aortic aneurysm) 10/30/2017  . AAA (abdominal aortic aneurysm) ([place] 10/28/2017  .  Abnormal EKG 10/28/2017  . Chest pain 10/28/2017  . Left knee pain 10/28/2017  . Left insertional Achilles tendinitis 10/28/2017  . 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   . Tailor's bunion of both feet 09/20/2017  . Ingrown nail 09/18/2017  . Tenosynovitis of right foot 09/18/2017  . Chronic heel pain, right 09/18/2017  . Dyslipidemia 05/28/2017  . Status post coronary artery stent placement 05/28/2017  . Abnormal chest CT 05/28/2017  . Coronary artery disease involving coronary bypass graft of native heart with angina pectoris ([place]) 05/20/2017  . Insomnia 07/16/2015  . Essential hypertension 07/06/2015  . Dyspnea on effort 07/06/2015   Kerin Perna, PTA 11/24/17 12:00 PM  [address] [place], [place], [address] Phone: [phone]   Fax:  [phone]  Name: Jerome Chase MRN: [id number] Date of Birth: November 06, 1950   PHYSICAL THERAPY DISCHARGE SUMMARY  Visits from Start of Care: 4 Current functional level related to goals / functional outcomes: Unknown, chart states he has returned to MD and is going to have knee injections.    Remaining deficits: unknown   Education / Equipment: Initial HEP Plan:                                                    Patient goals were not met. Patient is being discharged due to not returning since the last visit.  ?????    Jeral Pinch, PT 01/06/18 7:59 AM

## 2017-11-25 ENCOUNTER — Encounter: Payer: Medicare Other | Admitting: Physical Therapy

## 2017-11-25 ENCOUNTER — Encounter: Payer: Self-pay | Admitting: Sports Medicine

## 2017-11-25 ENCOUNTER — Encounter: Payer: Self-pay | Admitting: Osteopathic Medicine

## 2017-11-25 ENCOUNTER — Ambulatory Visit (INDEPENDENT_AMBULATORY_CARE_PROVIDER_SITE_OTHER): Payer: Medicare Other | Admitting: Osteopathic Medicine

## 2017-11-25 ENCOUNTER — Ambulatory Visit (INDEPENDENT_AMBULATORY_CARE_PROVIDER_SITE_OTHER): Payer: Medicare Other | Admitting: Sports Medicine

## 2017-11-25 VITALS — BP 149/87 | HR 52 | Temp 97.6°F | Wt 251.1 lb

## 2017-11-25 DIAGNOSIS — R9389 Abnormal findings on diagnostic imaging of other specified body structures: Secondary | ICD-10-CM

## 2017-11-25 DIAGNOSIS — Z951 Presence of aortocoronary bypass graft: Secondary | ICD-10-CM | POA: Diagnosis not present

## 2017-11-25 DIAGNOSIS — I25709 Atherosclerosis of coronary artery bypass graft(s), unspecified, with unspecified angina pectoris: Secondary | ICD-10-CM

## 2017-11-25 DIAGNOSIS — M67972 Unspecified disorder of synovium and tendon, left ankle and foot: Secondary | ICD-10-CM | POA: Diagnosis not present

## 2017-11-25 DIAGNOSIS — E785 Hyperlipidemia, unspecified: Secondary | ICD-10-CM | POA: Diagnosis not present

## 2017-11-25 DIAGNOSIS — I10 Essential (primary) hypertension: Secondary | ICD-10-CM | POA: Diagnosis not present

## 2017-11-25 DIAGNOSIS — M1712 Unilateral primary osteoarthritis, left knee: Secondary | ICD-10-CM

## 2017-11-25 MED ORDER — LISINOPRIL 5 MG PO TABS: 5.0000 mg | ORAL_TABLET | Freq: Every day | ORAL | 1 refills | Status: DC

## 2017-11-25 MED ORDER — NITROGLYCERIN 0.2 MG/HR TD PT24: MEDICATED_PATCH | TRANSDERMAL | 11 refills | Status: DC

## 2017-11-25 NOTE — Patient Instructions (Signed)
Will start another blood pressure medicine called Lisinopril. Will start a low dose at 5 mg, and see how you do with this.   Nurse visit to recheck BP in 2 weeks. If BP higher than 140/90, can increase lisinopril to 10 mg.

## 2017-11-25 NOTE — Assessment & Plan Note (Signed)
Likely medial meniscus tear with osteoarthritis. Fantastic relief with steroid injection a month ago. Still with some residual pain, he does have red white and blue Medicare so we started Visco supplementation today, return in 1 week for injection #2 of 4 of Orthovisc into the left knee.

## 2017-11-25 NOTE — Assessment & Plan Note (Signed)
Persistent pain, right-sided, I do think this is evolving into a retrocalcaneal bursitis. Retrocalcaneal bursa injection taking care to avoid intra-Achilles injection, he will need a boot for a week afterwards. Adding topical nitroglycerin patches and he can continue formal physical therapy. I am also going to add an additional heel lift on top of the one he already has.

## 2017-11-25 NOTE — Progress Notes (Signed)
Subjective:    CC: Follow-up  HPI: Left knee pain: Primary osteoarthritis on x-ray, we also had a small suspicion of a medial meniscal tear, he responded extremely well to a steroid injection at the last visit, with 50-60% relief of pain, for the most part no mechanical symptoms.  Still has a bit of discomfort, gelling, mild, persistent without radiation.  Agreeable to proceed with Visco supplementation.  Right ankle pain: Insertional Achilles tendinitis, it has become somewhat swollen over the past month in spite of NSAIDs, heel lift, physical therapy.  Worsening, persistent.  Currently working [employer], this is exacerbating his pain.  I reviewed the past medical history, family history, social history, surgical history, and allergies today and no changes were needed.  Please see the problem list section below in epic for further details.  Past Medical History: Past Medical History:  Diagnosis Date  . AAA (abdominal aortic aneurysm) ([facility]) 10/28/2017  . Abnormal EKG 10/28/2017  . Coronary artery disease involving coronary bypass graft of native heart with angina pectoris ([facility]) 05/20/2017  . Dyslipidemia 05/28/2017  . Essential hypertension 07/06/2015  . Heart attack ([facility])   . Hypertension   . Status post coronary artery stent placement 05/28/2017   Past Surgical History: Past Surgical History:  Procedure Laterality Date  . CARDIAC SURGERY     Social History: Social History   Socioeconomic History  . Marital status: Married    Spouse name: Ruthy Dickamela Pasko  . Number of children: 3  . Years of education: Not on file  . Highest education level: Associate degree: occupational, Scientist, product/process developmenttechnical, or vocational program  Social Needs  . Financial resource strain: Not on file  . Food insecurity - worry: Not on file  . Food insecurity - inability: Not on file  . Transportation needs - medical: Not on file  . Transportation needs - non-medical: Not on file  Occupational History  .  Occupation: Sport and exercise psychologistlectrician     Employer: [employer]   Tobacco Use  . Smoking status: Former Smoker    Types: Cigarettes    Last attempt to quit: 07/05/2005    Years since quitting: 12.4  . Smokeless tobacco: Never Used  Substance and Sexual Activity  . Alcohol use: No  . Drug use: No  . Sexual activity: Not Currently    Partners: Female    Birth control/protection: None  Other Topics Concern  . Not on file  Social History Narrative  . Not on file   Family History: Family History  Problem Relation Age of Onset  . Congestive Heart Failure Mother   . Heart attack Mother   . Cancer Father        Lung and stomach CA  . Diabetes Brother        2 brothers  . Heart attack Brother    Allergies: Allergies  Allergen Reactions  . Morphine Hives  . Hydrocodone-Acetaminophen Nausea And Vomiting  . Percocet [Oxycodone-Acetaminophen] Nausea And Vomiting  . Vicodin [Hydrocodone-Acetaminophen] Nausea And Vomiting   Medications: See med rec.  Review of Systems: No fevers, chills, night sweats, weight loss, chest pain, or shortness of breath.   Objective:    General: Well Developed, well nourished, and in no acute distress.  Neuro: Alert and oriented x3, extra-ocular muscles intact, sensation grossly intact.  HEENT: Normocephalic, atraumatic, pupils equal round reactive to light, neck supple, no masses, no lymphadenopathy, thyroid nonpalpable.  Skin: Warm and dry, no rashes. Cardiac: Regular rate and rhythm, no murmurs rubs or  gallops, no lower extremity edema.  Respiratory: Clear to auscultation bilaterally. Not using accessory muscles, speaking in full sentences. Left knee: Only minimally swollen, tenderness the medial joint line. ROM normal in flexion and extension and lower leg rotation. Ligaments with solid consistent endpoints including ACL, PCL, LCL, MCL. Negative Mcmurray's and provocative meniscal tests. Non painful patellar compression. Patellar and quadriceps tendons  unremarkable. Hamstring and quadriceps strength is normal. Right ankle: Swollen, slightly erythematous Achilles insertion with tenderness to palpation at the retrocalcaneal bursa, not so much at the calcaneal bursa or the Achilles insertion today. Range of motion is full in all directions. Strength is 5/5 in all directions. Stable lateral and medial ligaments; squeeze test and kleiger test unremarkable; Talar dome nontender; No pain at base of 5th MT; No tenderness over cuboid; No tenderness over N spot or navicular prominence No tenderness on posterior aspects of lateral and medial malleolus No sign of peroneal tendon subluxations; Negative tarsal tunnel tinel's Able to walk 4 steps.  Procedure: Real-time Ultrasound Guided Injection of left knee Device: GE Logiq E  Verbal informed consent obtained.  Time-out conducted.  Noted no overlying erythema, induration, or other signs of local infection.  Skin prepped in a sterile fashion.  Local anesthesia: Topical Ethyl chloride.  With sterile technique and under real time ultrasound guidance: 30 mg/2 mL of OrthoVisc (sodium hyaluronate) in a prefilled syringe was injected easily into the knee through a 22-gauge needle. Completed without difficulty  Pain immediately resolved suggesting accurate placement of the medication.  Advised to call if fevers/chills, erythema, induration, drainage, or persistent bleeding.  Images permanently stored and available for review in the ultrasound unit.  Impression: Technically successful ultrasound guided injection.  Procedure: Real-time Ultrasound Guided Injection of right retrocalcaneal bursa Device: GE Logiq E  Verbal informed consent obtained.  Time-out conducted.  Noted no overlying erythema, induration, or other signs of local infection.  Skin prepped in a sterile fashion.  Local anesthesia: Topical Ethyl chloride.  With sterile technique and under real time ultrasound guidance: Taking care to  avoid intra-Achilles injection I guided the needle just deep to the insertion of the Achilles at the calcaneus, I then injected 1 cc kenalog 40, 1 cc lidocaine.  The patient was then placed into a boot for safety. Completed without difficulty  Pain immediately resolved suggesting accurate placement of the medication.  Advised to call if fevers/chills, erythema, induration, drainage, or persistent bleeding.  Images permanently stored and available for review in the ultrasound unit.  Impression: Technically successful ultrasound guided injection.  Impression and Recommendations:    Left insertional Achilles tendinitis Persistent pain, right-sided, I do think this is evolving into a retrocalcaneal bursitis. Retrocalcaneal bursa injection taking care to avoid intra-Achilles injection, he will need a boot for a week afterwards. Adding topical nitroglycerin patches and he can continue formal physical therapy. I am also going to add an additional heel lift on top of the one he already has.  Primary osteoarthritis of left knee Likely medial meniscus tear with osteoarthritis. Fantastic relief with steroid injection a month ago. Still with some residual pain, he does have red white and blue Medicare so we started Visco supplementation today, return in 1 week for injection #2 of 4 of Orthovisc into the left knee. ___________________________________________ Ihor Austin. [name], M.D., ABFM., CAQSM. Primary Care and Sports Medicine [facility]  Adjunct Instructor of Family Medicine  [employer]

## 2017-11-25 NOTE — Progress Notes (Signed)
HPI: Jerome Chase is a 67 y.o. male who  has a past medical history of AAA (abdominal aortic aneurysm) ([facility]) (10/28/2017), Abnormal EKG (10/28/2017), Coronary artery disease involving coronary bypass graft of native heart with angina pectoris ([facility]) (05/20/2017), Dyslipidemia (05/28/2017), Essential hypertension (07/06/2015), Heart attack ([facility]), Hypertension, and Status post coronary artery stent placement (05/28/2017).  he presents to [facility] today, 11/25/17,  for chief complaint of:  Hx CAD, HTN Confusing story for lung cancer - following up today Knee pain, neck pain - has appt today w/ Dr Karie Schwalbe   Resp: Was told he had lung cancer but "not bad enough to do anything about" and following with pulmonology. (?nodule?). Received records from [facility]. No cancer. Significantly, he will require repeat CT scan of the chest to monitor lung nodule, will be due in September 2019. Abdominal aortic aneurysm screening was indeterminate given poor visualization, consider further imaging.  05/19/2017: CT chest for lung cancer screening. 10 mm noncalcified nodule in right upper lobe worrisome for primary lung neoplasm. PET/CT recommended for further evaluation.  05/29/2017: PET scan for lung cancer/pulmonary nodule. Impression showed right pulmonary nodules unchanged since 07/06/2015, no abnormal uptake. Nodules are almost certainly benign. Recommended follow-up CT scan in one year to confirm greater than 2 years of stability.  Cardiac: Following with cardiology, most recent visit 10/20/2017: Coronary artery disease involving coronary bypass graft, with angina. Also hypertension, hyperlipidemia, status post coronary artery stent placement, status post aortocoronary bypass graft.    Considering PCSK9 for target LDL, carvedilol was increased, regular exercise recommended. Patient declined culture/event monitor. Planning to follow up in 6 months.   Stress echo  September 2018 consistent with ischemia, anterior/anteroseptal walls.   EKG 1.5 mm ST depression.   I don't see labs in care everywhere other than most recent ones in [facility] from 06/08/2017.   Blood pressure a bit elevated, looks like was elevated at recent cardiology visit but this wasn't addressed in any detail.Patient is not on ACE inhibitor given history of CAD he states he doesn't think he has an allergy to anything like this. He states when blood pressure has been lower, he occasionally will get dizziness    Past medical history, surgical history, social history and family history reviewed. No updates needed.   Current medication list and allergy/intolerance information reviewed.    Current Outpatient Medications on File Prior to Visit  Medication Sig Dispense Refill  . acetaminophen (TYLENOL) 325 MG tablet Take 650 mg by mouth every 6 (six) hours as needed.    Marland Kitchen aspirin EC 81 MG tablet Take 81 mg by mouth daily.    Marland Kitchen atorvastatin (LIPITOR) 80 MG tablet Take 80 mg by mouth daily.    . carvedilol (COREG) 12.5 MG tablet Take 12.5 mg by mouth 2 (two) times daily.    . clopidogrel (PLAVIX) 75 MG tablet Take 75 mg by mouth daily.    Marland Kitchen esomeprazole (NEXIUM) 20 MG capsule Take 20 mg by mouth as needed.    . isosorbide mononitrate (IMDUR) 60 MG 24 hr tablet Take 60 mg by mouth daily.    . methocarbamol (ROBAXIN) 500 MG tablet Take 1 tablet (500 mg total) by mouth 2 (two) times daily as needed for muscle spasms. 12 tablet 0  . nitroGLYCERIN (NITROSTAT) 0.4 MG SL tablet Place 0.4 mg under the tongue as needed.    . Vitamin D, Ergocalciferol, (DRISDOL) 50000 units CAPS capsule Take 50,000 Units by mouth once a  week.     No current facility-administered medications on file prior to visit.    Allergies  Allergen Reactions  . Morphine Hives  . Hydrocodone-Acetaminophen Nausea And Vomiting  . Percocet [Oxycodone-Acetaminophen] Nausea And Vomiting  . Vicodin  [Hydrocodone-Acetaminophen] Nausea And Vomiting      Review of Systems:  Constitutional: No recent illness  HEENT: No  headache, no vision change  Cardiac: No  chest pain, No  pressure, No palpitations  Respiratory:  No  shortness of breath. No  Cough  Gastrointestinal: No  abdominal pain, no change on bowel habits  Musculoskeletal: No new myalgia/arthralgia  Neurologic: No  weakness, No  Dizziness  Psychiatric: No  concerns with depression, No  concerns with anxiety  Exam:  BP (!) 149/87   Pulse (!) 52   Temp 97.6 F (36.4 C) (Oral)   Wt 251 lb 1.9 oz (113.9 kg)   BMI 34.06 kg/m   Constitutional: VS see above. General Appearance: alert, well-developed, well-nourished, NAD  Eyes: Normal lids and conjunctive, non-icteric sclera  Ears, Nose, Mouth, Throat: MMM, Normal external inspection ears/nares/mouth/lips/gums.  Neck: No masses, trachea midline.   Respiratory: Normal respiratory effort. no wheeze, no rhonchi, no rales  Cardiovascular: S1/S2 normal, no murmur, no rub/gallop auscultated. RRR.   Neurological: Normal balance/coordination. No tremor.  Skin: warm, dry, intact.   Psychiatric: Normal judgment/insight. Normal mood and affect. Oriented x3.     ASSESSMENT/PLAN: The primary encounter diagnosis was Abnormal chest CT. Diagnoses of Essential hypertension, Dyslipidemia, and Hx of CABG were also pertinent to this visit.  At low-dose lisinopril, CBC and a blood pressure a bit better to goal. Plan for repeat chest CT September of this year.   Meds ordered this encounter  Medications  . lisinopril (PRINIVIL,ZESTRIL) 5 MG tablet    Sig: Take 1 tablet (5 mg total) by mouth daily.    Dispense:  30 tablet    Refill:  1    Patient Instructions  Will start another blood pressure medicine called Lisinopril. Will start a low dose at 5 mg, and see how you do with this.   Nurse visit to recheck BP in 2 weeks. If BP higher than 140/90, can increase lisinopril to  10 mg.     Follow-up plan: Return for nurse visit BP recheck 2 weeks .  Visit summary with medication list and pertinent instructions was printed for patient to review, alert us if any changes needed. All questions at time of visit were answered - patient instructed to contact office with any additional concerns. ER/RTC precautions were reviewed with the patient and understanding verbalized.      Please note: voice recognition software was used to produce this document, and typos may escape review. Please contact Dr. Lyn HollingsheadAlexander for any needed clarifications.

## 2017-12-02 ENCOUNTER — Ambulatory Visit (INDEPENDENT_AMBULATORY_CARE_PROVIDER_SITE_OTHER): Payer: Medicare Other | Admitting: Sports Medicine

## 2017-12-02 ENCOUNTER — Encounter: Payer: Self-pay | Admitting: Sports Medicine

## 2017-12-02 DIAGNOSIS — M1712 Unilateral primary osteoarthritis, left knee: Secondary | ICD-10-CM

## 2017-12-02 DIAGNOSIS — M67972 Unspecified disorder of synovium and tendon, left ankle and foot: Secondary | ICD-10-CM

## 2017-12-02 DIAGNOSIS — I25709 Atherosclerosis of coronary artery bypass graft(s), unspecified, with unspecified angina pectoris: Secondary | ICD-10-CM | POA: Diagnosis not present

## 2017-12-02 NOTE — Progress Notes (Signed)
Subjective:    CC: Follow-up  HPI: Left knee osteoarthritis: Here for Orthovisc No. 2 of 4  Left insertional Achilles tendinitis/retrocalcaneal bursitis: Resolved after injection at the last visit into the retrocalcaneal bursa, heel lifts, topical nitroglycerin.  I reviewed the past medical history, family history, social history, surgical history, and allergies today and no changes were needed.  Please see the problem list section below in epic for further details.  Past Medical History: Past Medical History:  Diagnosis Date  . AAA (abdominal aortic aneurysm) (HCC) 10/28/2017  . Abnormal EKG 10/28/2017  . Coronary artery disease involving coronary bypass graft of native heart with angina pectoris (HCC) 05/20/2017  . Dyslipidemia 05/28/2017  . Essential hypertension 07/06/2015  . Heart attack (HCC)   . Hypertension   . Status post coronary artery stent placement 05/28/2017   Past Surgical History: Past Surgical History:  Procedure Laterality Date  . CARDIAC SURGERY     Social History: Social History   Socioeconomic History  . Marital status: Married    Spouse name: Ruthy Dickamela Carrillo  . Number of children: 3  . Years of education: Not on file  . Highest education level: Associate degree: occupational, Scientist, product/process developmenttechnical, or vocational program  Occupational History  . Occupation: Sport and exercise psychologistlectrician     Employer: [employer]   Social Needs  . Financial resource strain: Not on file  . Food insecurity:    Worry: Not on file    Inability: Not on file  . Transportation needs:    Medical: Not on file    Non-medical: Not on file  Tobacco Use  . Smoking status: Former Smoker    Types: Cigarettes    Last attempt to quit: 07/05/2005    Years since quitting: 12.4  . Smokeless tobacco: Never Used  Substance and Sexual Activity  . Alcohol use: No  . Drug use: No  . Sexual activity: Not Currently    Partners: Female    Birth control/protection: None  Lifestyle  . Physical activity:    Days  per week: 0 days    Minutes per session: Not on file  . Stress: Not on file  Relationships  . Social connections:    Talks on phone: Not on file    Gets together: Not on file    Attends religious service: Not on file    Active member of club or organization: Not on file    Attends meetings of clubs or organizations: Not on file    Relationship status: Not on file  Other Topics Concern  . Not on file  Social History Narrative  . Not on file   Family History: Family History  Problem Relation Age of Onset  . Congestive Heart Failure Mother   . Heart attack Mother   . Cancer Father        Lung and stomach CA  . Diabetes Brother        2 brothers  . Heart attack Brother    Allergies: Allergies  Allergen Reactions  . Morphine Hives  . Hydrocodone-Acetaminophen Nausea And Vomiting  . Percocet [Oxycodone-Acetaminophen] Nausea And Vomiting  . Vicodin [Hydrocodone-Acetaminophen] Nausea And Vomiting   Medications: See med rec.  Review of Systems: No fevers, chills, night sweats, weight loss, chest pain, or shortness of breath.   Objective:    General: Well Developed, well nourished, and in no acute distress.  Neuro: Alert and oriented x3, extra-ocular muscles intact, sensation grossly intact.  HEENT: Normocephalic, atraumatic, pupils equal round reactive to  light, neck supple, no masses, no lymphadenopathy, thyroid nonpalpable.  Skin: Warm and dry, no rashes. Cardiac: Regular rate and rhythm, no murmurs rubs or gallops, no lower extremity edema.  Respiratory: Clear to auscultation bilaterally. Not using accessory muscles, speaking in full sentences.  Procedure: Real-time Ultrasound Guided Injection of left knee Device: GE Logiq E  Verbal informed consent obtained.  Time-out conducted.  Noted no overlying erythema, induration, or other signs of local infection.  Skin prepped in a sterile fashion.  Local anesthesia: Topical Ethyl chloride.  With sterile technique and under  real time ultrasound guidance: 30 mg/2 mL of OrthoVisc (sodium hyaluronate) in a prefilled syringe was injected easily into the knee through a 22-gauge needle. Completed without difficulty  Pain immediately resolved suggesting accurate placement of the medication.  Advised to call if fevers/chills, erythema, induration, drainage, or persistent bleeding.  Images permanently stored and available for review in the ultrasound unit.  Impression: Technically successful ultrasound guided injection.  Impression and Recommendations:    Primary osteoarthritis of left knee Orthovisc injection #2 of 4 into the left knee, return in 1 week for #3 of 4  Left insertional Achilles tendinitis Suspected retrocalcaneal bursitis versus insertional Achilles tendinopathy. I did a retrocalcaneal bursa injection a week ago taking great care to avoid intra-Achilles injection. Kept him in a boot for 1 week thereafter. At this point is doing extremely well, has almost no pain, continues with a heel lifts as well as topical nitroglycerin patches. ___________________________________________ Jerome Chase. [name], M.D., ABFM., CAQSM. Primary Care and Sports Medicine [facility]  Adjunct Instructor of Family Medicine  [employer]

## 2017-12-02 NOTE — Assessment & Plan Note (Signed)
Suspected retrocalcaneal bursitis versus insertional Achilles tendinopathy. I did a retrocalcaneal bursa injection a week ago taking great care to avoid intra-Achilles injection. Kept him in a boot for 1 week thereafter. At this point is doing extremely well, has almost no pain, continues with a heel lifts as well as topical nitroglycerin patches.

## 2017-12-02 NOTE — Assessment & Plan Note (Signed)
Orthovisc injection #2 of 4 into the left knee, return in 1 week for #3 of 4 

## 2017-12-09 ENCOUNTER — Ambulatory Visit (INDEPENDENT_AMBULATORY_CARE_PROVIDER_SITE_OTHER): Payer: Medicare Other | Admitting: Sports Medicine

## 2017-12-09 ENCOUNTER — Ambulatory Visit: Payer: Medicare Other

## 2017-12-09 ENCOUNTER — Encounter: Payer: Self-pay | Admitting: Sports Medicine

## 2017-12-09 DIAGNOSIS — M1712 Unilateral primary osteoarthritis, left knee: Secondary | ICD-10-CM

## 2017-12-09 NOTE — Progress Notes (Signed)

## 2017-12-09 NOTE — Assessment & Plan Note (Signed)
Orthovisc number 3 of 4 into the left knee, return in one week for #4 of 4, starting to feel a bit better.

## 2017-12-16 ENCOUNTER — Ambulatory Visit (INDEPENDENT_AMBULATORY_CARE_PROVIDER_SITE_OTHER): Payer: Medicare Other | Admitting: Sports Medicine

## 2017-12-16 ENCOUNTER — Encounter: Payer: Self-pay | Admitting: Sports Medicine

## 2017-12-16 DIAGNOSIS — M1712 Unilateral primary osteoarthritis, left knee: Secondary | ICD-10-CM | POA: Diagnosis not present

## 2017-12-16 NOTE — Assessment & Plan Note (Signed)
Orthovisc No. 4 of 4, nearly pain-free now. I would like him to work with his PCP on weight loss, he will also discuss his elevated blood pressure with her, did not take his blood pressure medication this morning so I am not making any major changes today.

## 2017-12-16 NOTE — Progress Notes (Signed)

## 2018-07-13 ENCOUNTER — Encounter: Payer: Self-pay | Admitting: Osteopathic Medicine

## 2018-07-13 DIAGNOSIS — M25562 Pain in left knee: Secondary | ICD-10-CM | POA: Insufficient documentation

## 2019-10-06 DIAGNOSIS — M7731 Calcaneal spur, right foot: Secondary | ICD-10-CM | POA: Diagnosis not present

## 2019-10-06 DIAGNOSIS — M71571 Other bursitis, not elsewhere classified, right ankle and foot: Secondary | ICD-10-CM | POA: Diagnosis not present

## 2019-10-06 DIAGNOSIS — M7661 Achilles tendinitis, right leg: Secondary | ICD-10-CM | POA: Diagnosis not present

## 2019-10-06 DIAGNOSIS — M7672 Peroneal tendinitis, left leg: Secondary | ICD-10-CM | POA: Diagnosis not present

## 2019-12-07 DIAGNOSIS — H04121 Dry eye syndrome of right lacrimal gland: Secondary | ICD-10-CM | POA: Diagnosis not present

## 2020-01-19 DIAGNOSIS — Z23 Encounter for immunization: Secondary | ICD-10-CM | POA: Diagnosis not present

## 2020-02-23 DIAGNOSIS — I10 Essential (primary) hypertension: Secondary | ICD-10-CM | POA: Diagnosis not present

## 2020-02-23 DIAGNOSIS — I25709 Atherosclerosis of coronary artery bypass graft(s), unspecified, with unspecified angina pectoris: Secondary | ICD-10-CM | POA: Diagnosis not present

## 2020-02-23 DIAGNOSIS — E7849 Other hyperlipidemia: Secondary | ICD-10-CM | POA: Diagnosis not present

## 2020-04-02 DIAGNOSIS — I25709 Atherosclerosis of coronary artery bypass graft(s), unspecified, with unspecified angina pectoris: Secondary | ICD-10-CM | POA: Diagnosis not present

## 2020-04-04 DIAGNOSIS — Z955 Presence of coronary angioplasty implant and graft: Secondary | ICD-10-CM | POA: Diagnosis not present

## 2020-04-04 DIAGNOSIS — E7849 Other hyperlipidemia: Secondary | ICD-10-CM | POA: Diagnosis not present

## 2020-04-04 DIAGNOSIS — E785 Hyperlipidemia, unspecified: Secondary | ICD-10-CM | POA: Diagnosis not present

## 2020-04-04 DIAGNOSIS — I10 Essential (primary) hypertension: Secondary | ICD-10-CM | POA: Diagnosis not present

## 2020-04-04 DIAGNOSIS — I25709 Atherosclerosis of coronary artery bypass graft(s), unspecified, with unspecified angina pectoris: Secondary | ICD-10-CM | POA: Diagnosis not present

## 2020-10-10 DIAGNOSIS — E7849 Other hyperlipidemia: Secondary | ICD-10-CM | POA: Diagnosis not present

## 2020-10-10 DIAGNOSIS — I714 Abdominal aortic aneurysm, without rupture: Secondary | ICD-10-CM | POA: Diagnosis not present

## 2020-10-10 DIAGNOSIS — I25709 Atherosclerosis of coronary artery bypass graft(s), unspecified, with unspecified angina pectoris: Secondary | ICD-10-CM | POA: Diagnosis not present

## 2020-10-10 DIAGNOSIS — E7841 Elevated Lipoprotein(a): Secondary | ICD-10-CM | POA: Diagnosis not present

## 2020-10-10 DIAGNOSIS — I10 Essential (primary) hypertension: Secondary | ICD-10-CM | POA: Diagnosis not present

## 2020-10-12 DIAGNOSIS — E7841 Elevated Lipoprotein(a): Secondary | ICD-10-CM | POA: Insufficient documentation

## 2020-11-14 ENCOUNTER — Encounter: Payer: Self-pay | Admitting: Family Medicine

## 2020-11-14 ENCOUNTER — Other Ambulatory Visit: Payer: Self-pay

## 2020-11-14 ENCOUNTER — Ambulatory Visit (INDEPENDENT_AMBULATORY_CARE_PROVIDER_SITE_OTHER): Payer: Medicare Other | Admitting: Family Medicine

## 2020-11-14 ENCOUNTER — Ambulatory Visit (INDEPENDENT_AMBULATORY_CARE_PROVIDER_SITE_OTHER): Payer: Medicare Other

## 2020-11-14 VITALS — Temp 97.6°F | Wt 238.0 lb

## 2020-11-14 DIAGNOSIS — I25709 Atherosclerosis of coronary artery bypass graft(s), unspecified, with unspecified angina pectoris: Secondary | ICD-10-CM

## 2020-11-14 DIAGNOSIS — R079 Chest pain, unspecified: Secondary | ICD-10-CM

## 2020-11-14 DIAGNOSIS — R001 Bradycardia, unspecified: Secondary | ICD-10-CM | POA: Diagnosis not present

## 2020-11-14 DIAGNOSIS — M542 Cervicalgia: Secondary | ICD-10-CM | POA: Diagnosis not present

## 2020-11-14 DIAGNOSIS — M5412 Radiculopathy, cervical region: Secondary | ICD-10-CM | POA: Insufficient documentation

## 2020-11-14 LAB — BASIC METABOLIC PANEL
BUN: 13 mg/dL (ref 7–25)
CO2: 30 mmol/L (ref 20–32)
Calcium: 9.9 mg/dL (ref 8.6–10.3)
Chloride: 101 mmol/L (ref 98–110)
Creat: 1.17 mg/dL (ref 0.70–1.25)
Glucose, Bld: 95 mg/dL (ref 65–99)
Potassium: 4.9 mmol/L (ref 3.5–5.3)
Sodium: 136 mmol/L (ref 135–146)

## 2020-11-14 LAB — TROPONIN I: Troponin I: 6 ng/L (ref <=47)

## 2020-11-14 MED ORDER — PREDNISONE 10 MG (21) PO TBPK: ORAL_TABLET | ORAL | 0 refills | Status: DC

## 2020-11-14 MED ORDER — GABAPENTIN 300 MG PO CAPS: 300.0000 mg | ORAL_CAPSULE | Freq: Three times a day (TID) | ORAL | 3 refills | Status: DC

## 2020-11-14 NOTE — Assessment & Plan Note (Addendum)
History of chronic angina. Pain is more towards axilla and I think is referred from the neck/shoulder.  EKG does show a new bradycardia, I think this is related to his increase in carvedilol.  He will reduce this back to 12.5mg .  Check BMP and stat troponin.  I instructed him to contact his cardiologist as well.  He will check heart rate at home as well and let me know if this is not improving.

## 2020-11-14 NOTE — Patient Instructions (Signed)
Reduce carvedilol to 12.5mg  and check heart rate and blood pressure at home.  If this remains below 50 over the next couple of days let us know.  Have labs and xray completed.  Try prednisone and gabapentin for pain.

## 2020-11-14 NOTE — Progress Notes (Signed)
Jerome Chase - 70 y.o. male MRN [id number]  Date of birth: 1950/10/16  Subjective Chief Complaint  Patient presents with  . Shoulder Pain  . Chest Pain  . Tingling    HPI Jerome Chase is a 70 y.o. male here today with complaint of shoulder pain.  He has a history of CAD, HTN, HLD, DDD.  He has been seeing a primary care provider at [facility] most recently.  Symptoms started a couple of weeks ago.  Does not recall any injury or trauma.  He has had neck and chest pain associated with this.  Pain does radiate into the left arm and he has some tingling in the fingers.  Pain does worsen with movement of the arm and shoulder.    Chest pain is intermittent and he has felt some shortness of breath and increased fatigue over the past couple of week. He was seen at cardiologist's office last month.  Carvedilol increased due to anginal symptoms.  This change correlated somewhat with the onset of increased dyspnea and fatigue.  He has not contacted his cardiologist regarding this.    ROS:  A comprehensive ROS was completed and negative except as noted per HPI  Allergies  Allergen Reactions  . Morphine Hives  . Hydrocodone-Acetaminophen Nausea And Vomiting  . Percocet [Oxycodone-Acetaminophen] Nausea And Vomiting  . Vicodin [Hydrocodone-Acetaminophen] Nausea And Vomiting    Past Medical History:  Diagnosis Date  . AAA (abdominal aortic aneurysm) (HCC) 10/28/2017  . Abnormal EKG 10/28/2017  . Coronary artery disease involving coronary bypass graft of native heart with angina pectoris (HCC) 05/20/2017  . Dyslipidemia 05/28/2017  . Essential hypertension 07/06/2015  . Heart attack (HCC)   . Hypertension   . Status post coronary artery stent placement 05/28/2017    Past Surgical History:  Procedure Laterality Date  . CARDIAC SURGERY      Social History   Socioeconomic History  . Marital status: Married    Spouse name: Jazen Spraggins  . Number of children: 3  . Years of  education: Not on file  . Highest education level: Associate degree: occupational, Scientist, product/process development, or vocational program  Occupational History  . Occupation: Sport and exercise psychologist: [employer]   Tobacco Use  . Smoking status: Former Smoker    Types: Cigarettes    Quit date: 07/05/2005    Years since quitting: 15.3  . Smokeless tobacco: Never Used  Vaping Use  . Vaping Use: Never used  Substance and Sexual Activity  . Alcohol use: No  . Drug use: No  . Sexual activity: Not Currently    Partners: Female    Birth control/protection: None  Other Topics Concern  . Not on file  Social History Narrative  . Not on file   Social Determinants of Health   Financial Resource Strain: Not on file  Food Insecurity: Not on file  Transportation Needs: Not on file  Physical Activity: Not on file  Stress: Not on file  Social Connections: Not on file    Family History  Problem Relation Age of Onset  . Congestive Heart Failure Mother   . Heart attack Mother   . Cancer Father        Lung and stomach CA  . Diabetes Brother        2 brothers  . Heart attack Brother     Health Maintenance  Topic Date Due  . COLONOSCOPY (Pts 45-53yrs Insurance coverage will need to be confirmed)  Never done  . PNA vac Low Risk Adult (1 of 2 - PCV13) Never done  . COVID-19 Vaccine (1) 11/30/2020 (Originally 04/04/1956)  . INFLUENZA VACCINE  12/06/2020 (Originally 04/08/2020)  . TETANUS/TDAP  11/14/2021 (Originally 04/04/1970)  . Hepatitis C Screening  Completed  . HPV VACCINES  Aged Out     ----------------------------------------------------------------------------------------------------------------------------------------------------------------------------------------------------------------- Physical Exam Temp 97.6 F (36.4 C)   Wt 238 lb (108 kg)   SpO2 98%   BMI 32.28 kg/m   Physical Exam Constitutional:      Appearance: He is well-developed.  HENT:     Head: Normocephalic and  atraumatic.  Eyes:     General: No scleral icterus. Cardiovascular:     Rate and Rhythm: Normal rate and regular rhythm.  Pulmonary:     Effort: Pulmonary effort is normal.     Breath sounds: Normal breath sounds.  Musculoskeletal:     Comments: Positive spurlings on the L.  Grip strength normal.  Pain and mild weakness with testing of supraspinatus.  +empty can test.    Skin:    General: Skin is warm and dry.  Neurological:     General: No focal deficit present.     Mental Status: He is alert.  Psychiatric:        Mood and Affect: Mood normal.        Behavior: Behavior normal.      EKG:  Sinus Bradycardia with rate of 48BPM.  No block noted. No significant change other than slower rate compared to 07/06/2015 tracing.  ------------------------------------------------------------------------------------------------------------------------------------------------------------------------------------------------------------------- Assessment and Plan  Chest pain History of chronic angina. Pain is more towards axilla and I think is referred from the neck/shoulder.  EKG does show a new bradycardia, I think this is related to his increase in carvedilol.  He will reduce this back to 12.5mg .  Check BMP and stat troponin.  I instructed him to contact his cardiologist as well.  He will check heart rate at home as well and let me know if this is not improving.    Cervical radiculopathy Pain seems to be due to cervical radiculitis.  Start prednisone and gabapentin.  Xrays ordered.  If not improving will proceed with addition of PT and MRI.     Meds ordered this encounter  Medications  . predniSONE (STERAPRED UNI-PAK 21 TAB) 10 MG (21) TBPK tablet    Sig: Taper as directed on packaging.    Dispense:  21 tablet    Refill:  0  . gabapentin (NEURONTIN) 300 MG capsule    Sig: Take 1 capsule (300 mg total) by mouth 3 (three) times daily. Take 1 capsule qhs x1 week.  May increase to three  times per day if needed.    Dispense:  90 capsule    Refill:  3    No follow-ups on file.    This visit occurred during the SARS-CoV-2 public health emergency.  Safety protocols were in place, including screening questions prior to the visit, additional usage of staff PPE, and extensive cleaning of exam room while observing appropriate contact time as indicated for disinfecting solutions.

## 2020-11-14 NOTE — Assessment & Plan Note (Signed)
Pain seems to be due to cervical radiculitis.  Start prednisone and gabapentin.  Xrays ordered.  If not improving will proceed with addition of PT and MRI.

## 2021-04-25 ENCOUNTER — Ambulatory Visit (INDEPENDENT_AMBULATORY_CARE_PROVIDER_SITE_OTHER): Payer: Medicare Other | Admitting: Osteopathic Medicine

## 2021-04-25 ENCOUNTER — Other Ambulatory Visit: Payer: Self-pay

## 2021-04-25 ENCOUNTER — Encounter: Payer: Self-pay | Admitting: Osteopathic Medicine

## 2021-04-25 VITALS — BP 162/83 | HR 56 | Temp 97.7°F | Wt 234.1 lb

## 2021-04-25 DIAGNOSIS — Z8679 Personal history of other diseases of the circulatory system: Secondary | ICD-10-CM | POA: Diagnosis not present

## 2021-04-25 DIAGNOSIS — I25110 Atherosclerotic heart disease of native coronary artery with unstable angina pectoris: Secondary | ICD-10-CM

## 2021-04-25 DIAGNOSIS — M47814 Spondylosis without myelopathy or radiculopathy, thoracic region: Secondary | ICD-10-CM | POA: Diagnosis not present

## 2021-04-25 DIAGNOSIS — R9431 Abnormal electrocardiogram [ECG] [EKG]: Secondary | ICD-10-CM | POA: Diagnosis not present

## 2021-04-25 DIAGNOSIS — R232 Flushing: Secondary | ICD-10-CM

## 2021-04-25 DIAGNOSIS — R072 Precordial pain: Secondary | ICD-10-CM | POA: Diagnosis not present

## 2021-04-25 DIAGNOSIS — Z79899 Other long term (current) drug therapy: Secondary | ICD-10-CM | POA: Diagnosis not present

## 2021-04-25 DIAGNOSIS — I25709 Atherosclerosis of coronary artery bypass graft(s), unspecified, with unspecified angina pectoris: Secondary | ICD-10-CM | POA: Diagnosis not present

## 2021-04-25 DIAGNOSIS — R61 Generalized hyperhidrosis: Secondary | ICD-10-CM | POA: Diagnosis not present

## 2021-04-25 DIAGNOSIS — I10 Essential (primary) hypertension: Secondary | ICD-10-CM | POA: Diagnosis not present

## 2021-04-25 DIAGNOSIS — R911 Solitary pulmonary nodule: Secondary | ICD-10-CM | POA: Diagnosis not present

## 2021-04-25 DIAGNOSIS — R001 Bradycardia, unspecified: Secondary | ICD-10-CM

## 2021-04-25 DIAGNOSIS — I252 Old myocardial infarction: Secondary | ICD-10-CM | POA: Diagnosis not present

## 2021-04-25 DIAGNOSIS — Z87891 Personal history of nicotine dependence: Secondary | ICD-10-CM | POA: Diagnosis not present

## 2021-04-25 DIAGNOSIS — R0789 Other chest pain: Secondary | ICD-10-CM | POA: Diagnosis not present

## 2021-04-25 DIAGNOSIS — R0602 Shortness of breath: Secondary | ICD-10-CM | POA: Diagnosis not present

## 2021-04-25 DIAGNOSIS — J90 Pleural effusion, not elsewhere classified: Secondary | ICD-10-CM | POA: Diagnosis not present

## 2021-04-25 DIAGNOSIS — E785 Hyperlipidemia, unspecified: Secondary | ICD-10-CM | POA: Diagnosis not present

## 2021-04-25 DIAGNOSIS — Z7982 Long term (current) use of aspirin: Secondary | ICD-10-CM | POA: Diagnosis not present

## 2021-04-25 DIAGNOSIS — Z951 Presence of aortocoronary bypass graft: Secondary | ICD-10-CM | POA: Diagnosis not present

## 2021-04-25 DIAGNOSIS — Z7902 Long term (current) use of antithrombotics/antiplatelets: Secondary | ICD-10-CM | POA: Diagnosis not present

## 2021-04-25 DIAGNOSIS — I2510 Atherosclerotic heart disease of native coronary artery without angina pectoris: Secondary | ICD-10-CM | POA: Diagnosis not present

## 2021-04-25 MED ORDER — NITROGLYCERIN 0.4 MG SL SUBL: 0.4000 mg | SUBLINGUAL_TABLET | SUBLINGUAL | 1 refills | Status: DC | PRN

## 2021-04-25 MED ORDER — CLOPIDOGREL BISULFATE 75 MG PO TABS: 75.0000 mg | ORAL_TABLET | Freq: Every day | ORAL | 0 refills | Status: DC

## 2021-04-25 MED ORDER — ATORVASTATIN CALCIUM 80 MG PO TABS: 80.0000 mg | ORAL_TABLET | Freq: Every day | ORAL | 3 refills | Status: DC

## 2021-04-25 NOTE — Progress Notes (Signed)
Jerome Chase is a 70 y.o. male who presents to  [facility] Medicine at [facility]  today, 04/25/21, seeking care for the following:  Chest discomfort and sweating/clammy "hot flashes" ongoing intermittently. Worse w/ exertion but resolve w/ rest, however he is occasionally experiencing these symptoms at rest, too. Last as long as an hour. Has bene out of SL nitro. Has not been taking statin or Plavix. Last appt w/ cardiology notes reviewed. 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 Diagnoses of Coronary artery disease involving coronary bypass graft of native heart with angina pectoris [facility]), Chest discomfort, Abnormal flushing and sweating, Sinus bradycardia, and Abnormal EKG were also pertinent to this visit.   EKG shows new T inversions in V3, question lead placement compared to previous EKG.   Cardiac exam WNL for his history.   BP above goal.  BP Readings from Last 3 Encounters:  04/25/21 (!) 162/83  12/16/17 (!) 150/80  12/09/17 127/73    I advised patient he should go to ER given his high risk of CAD/ACS. TIMI score 5 based on hx/symptoms, no labs today. He declines ER transfer. I have explained the risks of forgoing ER visit including death, I have explained the benefits of hospitalization/ER in terms of complete workup and presence of treatments if he gets worse, I have presented an alternative of refilling his nitro and try for getting him into cardiology sooner than currently scheduled but he risks missing a diagnosis / getting worse and not having available treatment at hand. He opts to refill med and he will call cardiology, I will fax notes to cardiology ASAP.  He has capacity to give informed consent to treatment and he has capacity to refuse medical advice. Needs to restart meds for  secondary prevention. Needs to take lisinopril DAILY and NOT prn    Patient Instructions  CHEST PAIN AND DIAPHORESIS (SWEATY/CLAMMY FEELING), ESPECIALLY IF IT HAPPENS WHILE YOU ARE AT REST, IS VERY CONCERNING FOR POSSIBLE HEART ATTACK / HIGH RISK OF HEART ATTACK. I recommend you go tot he hospital to get checked out, where they can perform confirmatory testing, monitor you, and treat immediately if there is a problem. You have decided against ER transfer at this time, and that is your choice to make since you understand the risks/benefits and can make a decision for yourself. I will refill the nitroglycerin and send a report to your cardiologist to get you seen ASAP, but if you are worse especially if your chest pain does not respond to nitroglycerin, you NEED to call 911 / go to the hospital. I refilled you rnitro pills, I also refilled you atorvastatin and your Plavix (you were taking these as of last cardiology encounter)   Orders Placed This Encounter  Procedures   EKG 12-Lead   Rhythm ECG, report       Meds ordered this encounter  Medications   nitroGLYCERIN (NITROSTAT) 0.4 MG SL tablet    Sig: Place 1 tablet (0.4 mg total) under the tongue every 5 (five) minutes as needed (if taken 2 doses and chest pain persists, call 911 / to ER).    Dispense:  15 tablet    Refill:  1   atorvastatin (LIPITOR) 80 MG tablet    Sig: Take 1 tablet (80 mg total) by mouth daily.    Dispense:  90 tablet    Refill:  3   clopidogrel (PLAVIX) 75 MG tablet    Sig: Take 1 tablet (75 mg total) by mouth daily.    Dispense:  90 tablet    Refill:  0     See below for relevant physical exam findings  See below for recent lab and imaging results reviewed  Medications, allergies, PMH, PSH, SocH, FamH reviewed below    Follow-up instructions: Return for ER IF WORSE.                                        Exam:  BP (!) 162/83 (BP Location: Left Arm, Patient Position:  Sitting, Cuff Size: Large)   Pulse (!) 56   Temp 97.7 F (36.5 C) (Oral)   Wt 234 lb 1.3 oz (106.2 kg)   BMI 31.75 kg/m  Constitutional: VS see above. General Appearance: alert, well-developed, well-nourished, NAD Neck: No masses, trachea midline.  Respiratory: Normal respiratory effort. no wheeze, no rhonchi, no rales Cardiovascular: S1/S2 normal, no murmur, no rub/gallop auscultated. RRR.  Musculoskeletal: Gait normal. Symmetric and independent movement of all extremities Neurological: Normal balance/coordination. No tremor. Skin: warm, dry, intact.  Psychiatric: Normal judgment/insight. Normal mood and affect. Oriented x3.   Current Meds  Medication Sig   aspirin EC 81 MG tablet Take 81 mg by mouth daily.   carvedilol (COREG) 12.5 MG tablet Take 12.5 mg by mouth 2 (two) times daily.   gabapentin (NEURONTIN) 300 MG capsule Take 1 capsule (300 mg total) by mouth 3 (three) times daily. Take 1 capsule qhs x1 week.  May increase to three times per day if needed.   isosorbide mononitrate (IMDUR) 60 MG 24 hr tablet Take 60 mg by mouth daily.   lisinopril (PRINIVIL,ZESTRIL) 5 MG tablet Take 1 tablet (5 mg total) by mouth daily.    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   Left knee pain 07/13/2018   Calcaneal spur of foot, right 10/30/2017   Neck pain 10/30/2017   Screening for AAA (abdominal aortic aneurysm) 10/30/2017   AAA (abdominal aortic aneurysm) (HCC) 10/28/2017   Abnormal EKG 10/28/2017   Chest pain 10/28/2017   Primary osteoarthritis of left knee 10/28/2017   Left insertional Achilles tendinitis 10/28/2017   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   Tailor's  bunion of both feet 09/20/2017   Ingrown nail 09/18/2017   Tenosynovitis of right foot 09/18/2017   Chronic heel pain, right 09/18/2017   Dyslipidemia 05/28/2017   Status post coronary artery stent placement 05/28/2017   Abnormal chest CT 05/28/2017   Coronary artery disease involving coronary bypass graft of native heart with angina pectoris (HCC) 05/20/2017   Insomnia 07/16/2015   Essential hypertension 07/06/2015   Dyspnea on effort 07/06/2015    Family History  Problem Relation Age of Onset   Congestive Heart Failure Mother    Heart attack Mother    Cancer Father        Lung and stomach CA   Diabetes Brother        2 brothers   Heart attack Brother     Social History   Tobacco Use  Smoking Status Former   Types: Cigarettes   Quit date: 07/05/2005   Years since quitting: 15.8  Smokeless Tobacco Never    Past Surgical History:  Procedure Laterality Date   CARDIAC SURGERY      Immunization History  Administered Date(s) Administered   Influenza-Unspecified 08/08/2017    No results found for this or any previous visit (from the past 2160 hour(s)).  No results found.     All questions at time of visit were answered - patient instructed to contact office with any additional concerns or updates. ER/RTC precautions were reviewed with the patient as applicable.   Please note: manual typing as well as voice recognition software may have been used to produce this document - typos may escape review. Please contact Dr. Lyn Hollingshead for any needed clarifications.   Total encounter time on date of service, 04/25/21, was 40 minutes spent addressing problems/issues as noted above in Assessment & Plan, including time spent in discussion with patient regarding the HPI, ROS, confirming history, reviewing Assessment & Plan, as well as time spent on coordination of care, record review.

## 2021-04-25 NOTE — Patient Instructions (Addendum)
CHEST PAIN AND DIAPHORESIS (SWEATY/CLAMMY FEELING), ESPECIALLY IF IT HAPPENS WHILE YOU ARE AT REST, IS VERY CONCERNING FOR POSSIBLE HEART ATTACK / HIGH RISK OF HEART ATTACK. I recommend you go tot he hospital to get checked out, where they can perform confirmatory testing, monitor you, and treat immediately if there is a problem. You have decided against ER transfer at this time, and that is your choice to make since you understand the risks/benefits and can make a decision for yourself. I will refill the nitroglycerin and send a report to your cardiologist to get you seen ASAP, but if you are worse especially if your chest pain does not respond to nitroglycerin, you NEED to call 911 / go to the hospital. I refilled you rnitro pills, I also refilled you atorvastatin and your Plavix (you were taking these as of last cardiology encounter)

## 2021-04-26 DIAGNOSIS — R911 Solitary pulmonary nodule: Secondary | ICD-10-CM | POA: Diagnosis not present

## 2021-04-26 DIAGNOSIS — R0602 Shortness of breath: Secondary | ICD-10-CM | POA: Diagnosis not present

## 2021-04-26 DIAGNOSIS — I10 Essential (primary) hypertension: Secondary | ICD-10-CM | POA: Diagnosis not present

## 2021-04-26 DIAGNOSIS — R42 Dizziness and giddiness: Secondary | ICD-10-CM | POA: Diagnosis not present

## 2021-04-26 DIAGNOSIS — Z87891 Personal history of nicotine dependence: Secondary | ICD-10-CM | POA: Diagnosis not present

## 2021-04-26 DIAGNOSIS — R001 Bradycardia, unspecified: Secondary | ICD-10-CM | POA: Diagnosis not present

## 2021-04-26 DIAGNOSIS — E785 Hyperlipidemia, unspecified: Secondary | ICD-10-CM | POA: Diagnosis not present

## 2021-04-29 ENCOUNTER — Telehealth: Payer: Self-pay | Admitting: *Deleted

## 2021-04-29 NOTE — Telephone Encounter (Signed)
Transition Care Management Follow-up Telephone Call Date of discharge and from where: 04/26/2021 - [facility] How have you been since you were released from the hospital? "Alright, I guess" Any questions or concerns? No  Items Reviewed: Did the pt receive and understand the discharge instructions provided? Yes  Medications obtained and verified? Yes  Other? No  Any new allergies since your discharge? No  Dietary orders reviewed? No Do you have support at home? Yes    Functional Questionnaire: (I = Independent and D = Dependent) ADLs: I  Bathing/Dressing- I  Meal Prep- I  Eating- I  Maintaining continence- I  Transferring/Ambulation- I  Managing Meds- I  Follow up appointments reviewed:  PCP Hospital f/u appt confirmed? No   Specialist Hospital f/u appt confirmed? No   Are transportation arrangements needed? No  If their condition worsens, is the pt aware to call PCP or go to the Emergency Dept.? Yes Was the patient provided with contact information for the PCP's office or ED? Yes Was to pt encouraged to call back with questions or concerns? Yes

## 2021-05-14 DIAGNOSIS — E7849 Other hyperlipidemia: Secondary | ICD-10-CM | POA: Diagnosis not present

## 2021-05-14 DIAGNOSIS — I714 Abdominal aortic aneurysm, without rupture: Secondary | ICD-10-CM | POA: Diagnosis not present

## 2021-05-14 DIAGNOSIS — I25709 Atherosclerosis of coronary artery bypass graft(s), unspecified, with unspecified angina pectoris: Secondary | ICD-10-CM | POA: Diagnosis not present

## 2021-05-14 DIAGNOSIS — I10 Essential (primary) hypertension: Secondary | ICD-10-CM | POA: Diagnosis not present

## 2021-07-20 ENCOUNTER — Other Ambulatory Visit: Payer: Self-pay | Admitting: Osteopathic Medicine

## 2021-09-16 ENCOUNTER — Encounter: Payer: Self-pay | Admitting: Physician Assistant

## 2021-09-16 ENCOUNTER — Other Ambulatory Visit: Payer: Self-pay

## 2021-09-16 ENCOUNTER — Ambulatory Visit (INDEPENDENT_AMBULATORY_CARE_PROVIDER_SITE_OTHER): Payer: Medicare Other | Admitting: Physician Assistant

## 2021-09-16 VITALS — BP 143/98 | HR 65 | Ht 73.0 in | Wt 216.0 lb

## 2021-09-16 DIAGNOSIS — I25709 Atherosclerosis of coronary artery bypass graft(s), unspecified, with unspecified angina pectoris: Secondary | ICD-10-CM | POA: Diagnosis not present

## 2021-09-16 DIAGNOSIS — M549 Dorsalgia, unspecified: Secondary | ICD-10-CM | POA: Diagnosis not present

## 2021-09-16 DIAGNOSIS — R200 Anesthesia of skin: Secondary | ICD-10-CM | POA: Diagnosis not present

## 2021-09-16 DIAGNOSIS — I25110 Atherosclerotic heart disease of native coronary artery with unstable angina pectoris: Secondary | ICD-10-CM | POA: Diagnosis not present

## 2021-09-16 DIAGNOSIS — R0602 Shortness of breath: Secondary | ICD-10-CM

## 2021-09-16 DIAGNOSIS — R1013 Epigastric pain: Secondary | ICD-10-CM

## 2021-09-16 DIAGNOSIS — R202 Paresthesia of skin: Secondary | ICD-10-CM

## 2021-09-16 DIAGNOSIS — M5030 Other cervical disc degeneration, unspecified cervical region: Secondary | ICD-10-CM | POA: Diagnosis not present

## 2021-09-16 DIAGNOSIS — I252 Old myocardial infarction: Secondary | ICD-10-CM | POA: Diagnosis not present

## 2021-09-16 LAB — CK TOTAL AND CKMB (NOT AT ARMC)
CK, MB: 6.1 ng/mL — ABNORMAL HIGH (ref 0–5.0)
Relative Index: 1.3 (ref 0–4.0)
Total CK: 460 U/L — ABNORMAL HIGH (ref 44–196)

## 2021-09-16 LAB — D-DIMER, QUANTITATIVE: D-Dimer, Quant: 0.49 mcg/mL FEU (ref <0.50)

## 2021-09-16 LAB — COMPLETE METABOLIC PANEL WITH GFR
AG Ratio: 1.7 (calc) (ref 1.0–2.5)
ALT: 25 U/L (ref 9–46)
AST: 29 U/L (ref 10–35)
Albumin: 4.7 g/dL (ref 3.6–5.1)
Alkaline phosphatase (APISO): 78 U/L (ref 35–144)
BUN: 13 mg/dL (ref 7–25)
CO2: 26 mmol/L (ref 20–32)
Calcium: 9.8 mg/dL (ref 8.6–10.3)
Chloride: 102 mmol/L (ref 98–110)
Creat: 1.04 mg/dL (ref 0.70–1.28)
Globulin: 2.7 g/dL (calc) (ref 1.9–3.7)
Glucose, Bld: 97 mg/dL (ref 65–99)
Potassium: 4.5 mmol/L (ref 3.5–5.3)
Sodium: 139 mmol/L (ref 135–146)
Total Bilirubin: 0.9 mg/dL (ref 0.2–1.2)
Total Protein: 7.4 g/dL (ref 6.1–8.1)
eGFR: 77 mL/min/{1.73_m2} (ref >=60)

## 2021-09-16 LAB — CBC
HCT: 46.7 % (ref 38.5–50.0)
Hemoglobin: 16.1 g/dL (ref 13.2–17.1)
MCH: 30 pg (ref 27.0–33.0)
MCHC: 34.5 g/dL (ref 32.0–36.0)
MCV: 87.1 fL (ref 80.0–100.0)
MPV: 9.9 fL (ref 7.5–12.5)
Platelets: 174 10*3/uL (ref 140–400)
RBC: 5.36 10*6/uL (ref 4.20–5.80)
RDW: 13.5 % (ref 11.0–15.0)
WBC: 7.5 10*3/uL (ref 3.8–10.8)

## 2021-09-16 LAB — MAGNESIUM: Magnesium: 2.2 mg/dL (ref 1.5–2.5)

## 2021-09-16 LAB — TROPONIN I: Troponin I: 8 ng/L (ref <=47)

## 2021-09-16 NOTE — Progress Notes (Signed)
Subjective:    Patient ID: Jerome Chase, male    DOB: 03/14/1951, 71 y.o.   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   Imdur Carvediolol Lisnoipril Lipitor  Plavix  Pt has not needed nitro for CP or relief.   Cardiologist has appt Thursday.   Pt is having numbness and tingling of his left arm into his middle and ring fingers, epigastric pain, mid back pain. He noticed his hands going numb at night more than during the day but now happening during the day. He does continue to work and lift heavy things but no known injury. Has known Cervical DDD.  No cough. He does have some intermittent SOB with activity. He just started nexium to see if will his epigastric pain.     .. Active Ambulatory Problems    Diagnosis Date Noted   AAA (abdominal aortic aneurysm) 10/28/2017   Abnormal EKG 10/28/2017   Chest pain 10/28/2017   Coronary artery disease involving coronary bypass graft of native heart with angina pectoris (HCC) 05/20/2017   Dyslipidemia 05/28/2017   Essential hypertension 07/06/2015   Ingrown nail 09/18/2017   Insomnia 07/16/2015   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   Dyspnea on effort 07/06/2015   Status post coronary artery stent placement 05/28/2017   Tailor's bunion of both feet 09/20/2017   Tenosynovitis of right foot 09/18/2017   Chronic heel pain, right 09/18/2017   Abnormal chest CT 05/28/2017   Primary osteoarthritis of left knee 10/28/2017   Left insertional Achilles tendinitis 10/28/2017   Calcaneal spur of foot, right 10/30/2017   Neck pain 10/30/2017   Screening for AAA (abdominal aortic aneurysm) 10/30/2017   Left knee pain 07/13/2018   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   Cervical radiculopathy 11/14/2020   SOB (shortness  of breath) 09/16/2021   Numbness and tingling in left arm 09/16/2021   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     Objective:   Physical Exam Vitals reviewed.  Constitutional:      Appearance: Normal appearance. He is obese.  HENT:     Head: Normocephalic.  Neck:     Vascular: No carotid bruit.  Cardiovascular:     Rate and Rhythm: Normal rate and regular rhythm.     Pulses: Normal pulses.  Pulmonary:     Effort: Pulmonary effort is normal.     Breath sounds: Normal breath sounds.  Abdominal:     General: Bowel sounds are normal. There is no distension.     Palpations: Abdomen is soft. There is no mass.     Tenderness: There is no abdominal tenderness. There is no right CVA tenderness, left CVA tenderness, guarding or rebound.     Hernia: No hernia is present.     Comments: Firmer RUQ, all other quadrants soft to palpation.  Bruit over AAA  Musculoskeletal:     Cervical back: Normal range  of motion and neck supple.     Right lower leg: No edema.     Left lower leg: No edema.     Comments: Positive phalens in left hand but bilateral negative tinels.  Upper ext strength 5/5 NROM of upper extremities No significant tenderness to palpation of the shoulder  Lymphadenopathy:     Cervical: No cervical adenopathy.  Skin:    Findings: No rash.  Neurological:     General: No focal deficit present.     Mental Status: He is alert and oriented to person, place, and time.  Psychiatric:        Mood and Affect: Mood normal.          Assessment & Plan:  Jerome Chase KitchenMarland KitchenJatniel was seen today for arm pain.  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) -     Troponin I - -     D-dimer, quantitative -     COMPLETE METABOLIC PANEL WITH GFR -     CK total and CKMB (cardiac)not at [facility] -     CBC -     Magnesium  Coronary artery disease involving coronary bypass graft of native heart with angina pectoris (HCC) -     Troponin I - -     D-dimer, quantitative -     COMPLETE METABOLIC PANEL WITH GFR -     CK total and CKMB (cardiac)not at [facility] -     CBC -     Magnesium  DDD (degenerative disc disease), cervical  SOB (shortness of breath) -     Troponin I - -     D-dimer, quantitative -     COMPLETE METABOLIC PANEL WITH GFR -     CK total and CKMB (cardiac)not at [facility] -     CBC -     Magnesium  Epigastric pain -     Lipase  Mid back pain  EKG showed improved from last EKG in 04/2021. Now only V5 and V6  Twaves are inverted apposed to V3-V6.  Sinus rhythm with sinus arrhthymias Not too concerning since only improved since last EKG Will get cardiac enzymes stat to look for any strain on the heart Suggest if all cardiac enzymes are negative then follow up with Dr. Karie Schwalbe for numbness and tingling of left arm that is likely some cervical radiculopathy from cervical DDD.  Ok to take tylenol Continue on nexium for now for epigastric pain.  Added lipase  Keep appt with cardiologist Rudean Haskell on Thursday.

## 2021-09-17 ENCOUNTER — Ambulatory Visit (INDEPENDENT_AMBULATORY_CARE_PROVIDER_SITE_OTHER): Payer: Medicare Other

## 2021-09-17 ENCOUNTER — Other Ambulatory Visit: Payer: Self-pay | Admitting: Neurology

## 2021-09-17 DIAGNOSIS — R200 Anesthesia of skin: Secondary | ICD-10-CM | POA: Diagnosis not present

## 2021-09-17 DIAGNOSIS — I252 Old myocardial infarction: Secondary | ICD-10-CM

## 2021-09-17 DIAGNOSIS — R202 Paresthesia of skin: Secondary | ICD-10-CM

## 2021-09-17 DIAGNOSIS — R0602 Shortness of breath: Secondary | ICD-10-CM | POA: Diagnosis not present

## 2021-09-17 DIAGNOSIS — R911 Solitary pulmonary nodule: Secondary | ICD-10-CM | POA: Diagnosis not present

## 2021-09-17 NOTE — Addendum Note (Signed)
Addended by: Chalmers Cater on: 09/17/2021 07:59 AM   Modules accepted: Orders

## 2021-09-17 NOTE — Progress Notes (Signed)
Magnesium looks good.  Normal hemoglobin Kidney, liver, glucose look good.  Negative d-dimer Troponin is negative but CK-MB is positive. Troponin is the more specific cardiac enzyme so reassuring.  I would like to get a CXR today. He has appt with cardiology on Thursday which is great.

## 2021-09-17 NOTE — Progress Notes (Signed)
No changes with chest xray.

## 2021-09-17 NOTE — Progress Notes (Signed)
Received alert from [facility] regarding critical values for pt.  Spoke with Liliana Cline, CMA and alerted her to please be sure that Tandy Gaw, PA-C is aware of critical results.  Liliana Cline expressed understanding and is agreeable.  Tiajuana Amass, CMA

## 2021-09-19 DIAGNOSIS — I10 Essential (primary) hypertension: Secondary | ICD-10-CM | POA: Diagnosis not present

## 2021-09-19 DIAGNOSIS — I2510 Atherosclerotic heart disease of native coronary artery without angina pectoris: Secondary | ICD-10-CM | POA: Diagnosis not present

## 2021-09-19 DIAGNOSIS — I25709 Atherosclerosis of coronary artery bypass graft(s), unspecified, with unspecified angina pectoris: Secondary | ICD-10-CM | POA: Diagnosis not present

## 2021-09-19 DIAGNOSIS — I714 Abdominal aortic aneurysm, without rupture, unspecified: Secondary | ICD-10-CM | POA: Diagnosis not present

## 2021-09-30 ENCOUNTER — Other Ambulatory Visit: Payer: Self-pay

## 2021-09-30 ENCOUNTER — Ambulatory Visit (INDEPENDENT_AMBULATORY_CARE_PROVIDER_SITE_OTHER): Payer: Medicare Other | Admitting: Sports Medicine

## 2021-09-30 DIAGNOSIS — N529 Male erectile dysfunction, unspecified: Secondary | ICD-10-CM | POA: Diagnosis not present

## 2021-09-30 DIAGNOSIS — I25709 Atherosclerosis of coronary artery bypass graft(s), unspecified, with unspecified angina pectoris: Secondary | ICD-10-CM | POA: Diagnosis not present

## 2021-09-30 DIAGNOSIS — M5412 Radiculopathy, cervical region: Secondary | ICD-10-CM

## 2021-09-30 MED ORDER — GABAPENTIN 300 MG PO CAPS: 300.0000 mg | ORAL_CAPSULE | Freq: Every day | ORAL | 3 refills | Status: DC

## 2021-09-30 MED ORDER — TADALAFIL 5 MG PO TABS: 5.0000 mg | ORAL_TABLET | Freq: Every day | ORAL | 11 refills | Status: DC

## 2021-09-30 MED ORDER — PREDNISONE 50 MG PO TABS: ORAL_TABLET | ORAL | 0 refills | Status: DC

## 2021-09-30 NOTE — Progress Notes (Signed)
° ° °  Procedures performed today:    None.  Independent interpretation of notes and tests performed by another provider:   None.  Brief History, Exam, Impression, and Recommendations:    Cervical radiculopathy This is a pleasant 71 year old male, he has a history of chronic neck pain, he has discussed cervical radiculitis with his PCP in the past. Prednisone and gabapentin were used a long time ago. He is having recurrence of neck pain with radiation down the left arm, numbness and tingling into the third and fourth fingers. Symptoms are not classic for carpal tunnel syndrome. I do think he is having recurrence of cervical radiculitis, adding formal PT, 5 days of prednisone, Neurontin at night as he does have some baseline insomnia. Return to see me in 6 weeks, MRI for interventional planning if no better.  Erectile dysfunction Advised to discuss this with his PCP, I will get him started with Cialis 5 daily.    ___________________________________________ Ihor Austin. [name], M.D., ABFM., CAQSM. Primary Care and Sports Medicine [facility]  Adjunct Instructor of Family Medicine  [employer]

## 2021-09-30 NOTE — Assessment & Plan Note (Signed)
Advised to discuss this with his PCP, I will get him started with Cialis 5 daily.

## 2021-09-30 NOTE — Assessment & Plan Note (Signed)
This is a pleasant 71 year old male, he has a history of chronic neck pain, he has discussed cervical radiculitis with his PCP in the past. Prednisone and gabapentin were used a long time ago. He is having recurrence of neck pain with radiation down the left arm, numbness and tingling into the third and fourth fingers. Symptoms are not classic for carpal tunnel syndrome. I do think he is having recurrence of cervical radiculitis, adding formal PT, 5 days of prednisone, Neurontin at night as he does have some baseline insomnia. Return to see me in 6 weeks, MRI for interventional planning if no better.

## 2021-10-02 ENCOUNTER — Ambulatory Visit: Payer: Medicare Other | Attending: Sports Medicine | Admitting: Rehabilitative and Restorative Service Providers"

## 2021-10-02 ENCOUNTER — Other Ambulatory Visit: Payer: Self-pay

## 2021-10-02 ENCOUNTER — Encounter: Payer: Self-pay | Admitting: Rehabilitative and Restorative Service Providers"

## 2021-10-02 DIAGNOSIS — R293 Abnormal posture: Secondary | ICD-10-CM | POA: Insufficient documentation

## 2021-10-02 DIAGNOSIS — R29898 Other symptoms and signs involving the musculoskeletal system: Secondary | ICD-10-CM | POA: Diagnosis not present

## 2021-10-02 DIAGNOSIS — M5412 Radiculopathy, cervical region: Secondary | ICD-10-CM | POA: Diagnosis not present

## 2021-10-02 NOTE — Patient Instructions (Signed)
Access Code: [email or web address] URL: [email or web address] Date: 10/02/2021 Prepared by: Corlis Leak  Exercises Seated Cervical Retraction - 3 x daily - 7 x weekly - 1 sets - 10 reps Standing Scapular Retraction - 3 x daily - 7 x weekly - 1 sets - 10 reps - 10 hold Shoulder External Rotation and Scapular Retraction - 3 x daily - 7 x weekly - 1 sets - 10 reps - hold Shoulder External Rotation in 45 Degrees Abduction - 2 x daily - 7 x weekly - 1-2 sets - 10 reps - 3 sec hold Doorway Pec Stretch at 60 Degrees Abduction - 3 x daily - 7 x weekly - 1 sets - 3 reps Doorway Pec Stretch at 90 Degrees Abduction - 3 x daily - 7 x weekly - 1 sets - 3 reps - 30 seconds hold Doorway Pec Stretch at 120 Degrees Abduction - 3 x daily - 7 x weekly - 1 sets - 3 reps - 30 second hold hold  Patient Education Office Posture TENS Unit Trigger Point Dry Needling
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
## 2021-10-08 ENCOUNTER — Ambulatory Visit: Payer: Medicare Other | Admitting: Rehabilitative and Restorative Service Providers"

## 2021-10-08 ENCOUNTER — Encounter: Payer: Self-pay | Admitting: Rehabilitative and Restorative Service Providers"

## 2021-10-08 ENCOUNTER — Other Ambulatory Visit: Payer: Self-pay

## 2021-10-08 DIAGNOSIS — R293 Abnormal posture: Secondary | ICD-10-CM

## 2021-10-08 DIAGNOSIS — R29898 Other symptoms and signs involving the musculoskeletal system: Secondary | ICD-10-CM

## 2021-10-08 DIAGNOSIS — M5412 Radiculopathy, cervical region: Secondary | ICD-10-CM

## 2021-10-08 NOTE — Therapy (Signed)
[facility] [place] [address] [place], [place], [address] Phone: [phone]   Fax:  [phone]  Physical Therapy Treatment  Patient Details  Name: Jerome Chase MRN: [id number] Date of Birth: May 01, 1951 Referring Provider (PT): Dr [name]   Encounter Date: 10/08/2021   PT End of Session - 10/08/21 0852     Visit Number 2    Number of Visits 12    Date for PT Re-Evaluation 11/13/21    PT Start Time 0845    PT Stop Time 0934    PT Time Calculation (min) 49 min    Activity Tolerance Patient tolerated treatment well             Past Medical History:  Diagnosis Date   AAA (abdominal aortic aneurysm) 10/28/2017   Abnormal EKG 10/28/2017   Coronary artery disease involving coronary bypass graft of native heart with angina pectoris (HCC) 05/20/2017   Dyslipidemia 05/28/2017   Essential hypertension 07/06/2015   Heart attack (HCC)    Hypertension    Status post coronary artery stent placement 05/28/2017    Past Surgical History:  Procedure Laterality Date   CARDIAC SURGERY      There were no vitals filed for this visit.   Subjective Assessment - 10/08/21 0852     Subjective Less radicular numbness in the Lt arm but having pain in the neck which has increased in the past day or two. He was out of town over the weekend. Working on his exercises at home    Currently in Pain? Yes    Pain Score 6     Pain Location Neck    Pain Orientation Left    Pain Descriptors / Indicators Dull    Pain Type Chronic pain;Acute pain                               [facility] Adult PT Treatment/Exercise - 10/08/21 0001       Therapeutic Activites    Therapeutic Activities Other Therapeutic Activities      Neuro Re-ed    Neuro Re-ed Details  working on posture and alignment      Shoulder Exercises: Seated   Other Seated Exercises thoracic extension sitting with coregeous ball at thoracic spine       Shoulder Exercises: Standing   Other Standing Exercises chin tuck 5 sec x 5 reps; scap squeeze 5 sec x 10; L's x 10; W's x 10      Shoulder Exercises: Stretch   Other Shoulder Stretches doorway stretch x 3 positions 20 sec 3 reps each position      Moist Heat Therapy   Number Minutes Moist Heat 10 Minutes    Moist Heat Location Cervical;Shoulder      Electrical Stimulation   Electrical Stimulation Location Bilat cervical and upper trap    Electrical Stimulation Action TENs    Electrical Stimulation Parameters to toerance    Electrical Stimulation Goals Pain;Tone      Manual Therapy   Manual therapy comments skilled palpation to assess response to Dn and manual work    Joint Mobilization cervical lateral mobs Grade II/III    Soft tissue mobilization deep tissue work through the ant/lat/post cervical musculature; upper traps; pecs    Myofascial Release anterior chest/pecs    Manual Traction manual traction pt supine 10-15 sec hold x 3-4 reps  Trigger Point Dry Needling - 10/08/21 0001     Consent Given? Yes    Education Handout Provided Yes    Muscles Treated Head and Neck Upper trapezius;Suboccipitals;Oblique capitus;Levator scapulae;Scalenes;Splenius capitus;Semispinalis capitus;Cervical multifidi    Dry Needling Comments bilat    Upper Trapezius Response Palpable increased muscle length    Scalenes Response Palpable increased muscle length    Semispinalis capitus Response Palpable increased muscle length                        PT Long Term Goals - 10/02/21 1056       PT LONG TERM GOAL #1   Title Improve posture and alignment with patient to demonstrate improved upright posture with posterior shoulder girdle engaged    Time 6    Period Weeks    Status New    Target Date 11/13/21      PT LONG TERM GOAL #2   Title Decrease pain in the cervial spine and Lt UE allowing patient to perform functional activities with minimal pain and discomfort     Time 6    Period Weeks    Status New    Target Date 11/13/21      PT LONG TERM GOAL #3   Title Increase cervical ROM in lateral flexion and rotation by 5-7 degrees with no pain    Time 6    Period Weeks    Status New    Target Date 11/13/21      PT LONG TERM GOAL #4   Title Imdependent in HEP    Time 6    Period Weeks    Status New    Target Date 11/13/21      PT LONG TERM GOAL #5   Title Improve functional limitation score to 57    Time 6    Period Weeks    Status New    Target Date 11/13/21                    Patient will benefit from skilled therapeutic intervention in order to improve the following deficits and impairments:     Visit Diagnosis: Radiculopathy, cervical region  Other symptoms and signs involving the musculoskeletal system  Abnormal posture     Problem List Patient Active Problem List   Diagnosis Date Noted   Erectile dysfunction 09/30/2021   SOB (shortness of breath) 09/16/2021   Numbness and tingling in left arm 09/16/2021   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) 09/16/2021   History of MI (myocardial infarction) 09/16/2021   Epigastric pain 09/16/2021   Mid back pain 09/16/2021   Cervical radiculopathy 11/14/2020   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   Left knee pain 07/13/2018   Calcaneal spur of foot, right 10/30/2017   Neck pain 10/30/2017   Screening for AAA (abdominal aortic aneurysm) 10/30/2017   AAA (abdominal aortic aneurysm) 10/28/2017   Abnormal EKG 10/28/2017   Chest pain 10/28/2017   Primary osteoarthritis of left knee 10/28/2017   Left insertional Achilles tendinitis 10/28/2017   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   Tailor's bunion of both feet 09/20/2017   Ingrown nail 09/18/2017   Tenosynovitis of right foot 09/18/2017   Chronic heel pain, right 09/18/2017   Dyslipidemia 05/28/2017   Status post coronary artery stent placement 05/28/2017   Abnormal chest CT  05/28/2017   Coronary artery disease involving coronary bypass graft of native heart with angina pectoris (HCC) 05/20/2017   Insomnia  07/16/2015   Essential hypertension 07/06/2015   Dyspnea on effort 07/06/2015    Delano Scardino Rober Minion, PT, MPH  10/08/2021, 9:30 AM  [facility] [address] [place], [place], [address] Phone: [phone]   Fax:  [phone]  Name: Jerome Chase MRN: [id number] Date of Birth: Jun 19, 1951

## 2021-10-10 ENCOUNTER — Encounter: Payer: Self-pay | Admitting: Rehabilitative and Restorative Service Providers"

## 2021-10-10 ENCOUNTER — Ambulatory Visit: Payer: Medicare Other | Attending: Sports Medicine | Admitting: Rehabilitative and Restorative Service Providers"

## 2021-10-10 ENCOUNTER — Other Ambulatory Visit: Payer: Self-pay

## 2021-10-10 DIAGNOSIS — R29898 Other symptoms and signs involving the musculoskeletal system: Secondary | ICD-10-CM | POA: Insufficient documentation

## 2021-10-10 DIAGNOSIS — R293 Abnormal posture: Secondary | ICD-10-CM | POA: Diagnosis present

## 2021-10-10 DIAGNOSIS — M5412 Radiculopathy, cervical region: Secondary | ICD-10-CM | POA: Diagnosis not present

## 2021-10-10 NOTE — Patient Instructions (Signed)
Access Code: [email or web address] URL: [email or web address] Date: 10/10/2021 Prepared by: Corlis Leak  Exercises Seated Cervical Retraction - 3 x daily - 7 x weekly - 1 sets - 10 reps Standing Scapular Retraction - 3 x daily - 7 x weekly - 1 sets - 10 reps - 10 hold Shoulder External Rotation and Scapular Retraction - 3 x daily - 7 x weekly - 1 sets - 10 reps - hold Shoulder External Rotation in 45 Degrees Abduction - 2 x daily - 7 x weekly - 1-2 sets - 10 reps - 3 sec hold Doorway Pec Stretch at 60 Degrees Abduction - 3 x daily - 7 x weekly - 1 sets - 3 reps Doorway Pec Stretch at 90 Degrees Abduction - 3 x daily - 7 x weekly - 1 sets - 3 reps - 30 seconds hold Doorway Pec Stretch at 120 Degrees Abduction - 3 x daily - 7 x weekly - 1 sets - 3 reps - 30 second hold hold Shoulder External Rotation and Scapular Retraction with Resistance - 2 x daily - 7 x weekly - 3 sets - 10 reps Scapular Retraction with Resistance - 2 x daily - 7 x weekly - 3 sets - 10 reps Standing Shoulder Row Reactive Isometric - 2 x daily - 7 x weekly - 1 sets - 10 reps - 3-5 sec hold

## 2021-10-10 NOTE — Therapy (Signed)
[place], [place], [address] Phone: [phone]   Fax:  [phone]  Physical Therapy Treatment  Patient Details  Name: Jerome Chase MRN: [id number] Date of Birth: [birth year] Referring Provider (PT): Dr Dianah Field   Encounter Date: 10/10/2021   PT End of Session - 10/10/21 0847     Visit Number 3    Number of Visits 12    Date for PT Re-Evaluation 11/13/21    PT Start Time 0845    PT Stop Time 0933    PT Time Calculation (min) 48 min    Activity Tolerance Patient tolerated treatment well             Past Medical History:  Diagnosis Date   AAA (abdominal aortic aneurysm) 10/28/2017   Abnormal EKG 10/28/2017   Coronary artery disease involving coronary bypass graft of native heart with angina pectoris ([place]) 05/20/2017   Dyslipidemia 05/28/2017   Essential hypertension 07/06/2015   Heart attack ([place])    Hypertension    Status post coronary artery stent placement 05/28/2017    Past Surgical History:  Procedure Laterality Date   CARDIAC SURGERY      There were no vitals filed for this visit.   Subjective Assessment - 10/10/21 0848     Subjective Improving. Working on his posture - or trying to. Less radicular numbness in the Lt arm and less pain in the neck.    Currently in Pain? No/denies    Pain Score 0-No pain    Pain Location Neck    Pain Orientation Left                               [facility] Adult PT Treatment/Exercise - 10/10/21 0001       Neuro Re-ed    Neuro Re-ed Details  working on posture and alignment - encouraging posterior shoulder girdle activation      Shoulder Exercises: Seated   Other Seated Exercises thoracic extension sitting with coregeous ball at thoracic spine      Shoulder Exercises: Standing   Row Strengthening;Both;10 reps;Theraband    Theraband Level (Shoulder Row) Level 4 (Blue)    Row Limitations repeated x 5 with isometric  hold blue TB    Retraction Strengthening;Both;10 reps;Theraband    Theraband Level (Shoulder Retraction) Level 2 (Red)    Retraction Limitations back along noodle    Other Standing Exercises chin tuck 5 sec x 5 reps; scap squeeze 5 sec x 10; L's x 10; W's x 10      Shoulder Exercises: Stretch   Other Shoulder Stretches doorway stretch x 3 positions 20 sec 3 reps each position      Moist Heat Therapy   Number Minutes Moist Heat 10 Minutes    Moist Heat Location Cervical;Shoulder      Electrical Stimulation   Electrical Stimulation Location Bilat cervical and upper trap    Electrical Stimulation Action TENS    Electrical Stimulation Parameters to tolerance    Electrical Stimulation Goals Pain;Tone      Manual Therapy   Manual therapy comments skilled palpation to assess response to Dn and manual work    Joint Mobilization thoracic and cervical lateral mobs Grade II/III    Soft tissue mobilization deep tissue work through ant/lat/post cervical musculature; upper traps; thoracic paraspinals    Myofascial Release upper traps bilat  Trigger Point Dry Needling - 10/10/21 0001     Consent Given? Yes    Education Handout Provided Previously provided    Upper Trapezius Response Palpable increased muscle length    Suboccipitals Response Palpable increased muscle length;Twitch response elicited    Semispinalis capitus Response Palpable increased muscle length;Twitch reponse elicited    Cervical multifidi Response Palpable increased muscle length;Twitch reponse elicited                   PT Education - 10/10/21 0902     Education Details HEP    Person(s) Educated Patient    Methods Explanation;Demonstration;Tactile cues;Verbal cues;Handout    Comprehension Verbalized understanding;Returned demonstration;Verbal cues required;Tactile cues required                 PT Long Term Goals - 10/02/21 1056       PT LONG TERM GOAL #1   Title Improve posture  and alignment with patient to demonstrate improved upright posture with posterior shoulder girdle engaged    Time 6    Period Weeks    Status New    Target Date 11/13/21      PT LONG TERM GOAL #2   Title Decrease pain in the cervial spine and Lt UE allowing patient to perform functional activities with minimal pain and discomfort    Time 6    Period Weeks    Status New    Target Date 11/13/21      PT LONG TERM GOAL #3   Title Increase cervical ROM in lateral flexion and rotation by 5-7 degrees with no pain    Time 6    Period Weeks    Status New    Target Date 11/13/21      PT LONG TERM GOAL #4   Title Imdependent in HEP    Time 6    Period Weeks    Status New    Target Date 11/13/21      PT LONG TERM GOAL #5   Title Improve functional limitation score to 57    Time 6    Period Weeks    Status New    Target Date 11/13/21                   Plan - 10/10/21 0849     Clinical Impression Statement Patient reports improvement in neck pain and Lt UE radicular symptoms. No numbness today. Patient demonstrates improved posture with less head forward posture. Added posterior shoulder girdle strengthening resistance without difficulty. Working on thoracic extension. Patient is progressing well with rehab.    Rehab Potential Good    PT Frequency 2x / week    PT Duration 6 weeks    PT Treatment/Interventions ADLs/Self Care Home Management;Cryotherapy;Electrical Stimulation;Iontophoresis 4mg /ml Dexamethasone;Moist Heat;Ultrasound;Therapeutic activities;Therapeutic exercise;Balance training;Neuromuscular re-education;Patient/family education;Manual techniques;Dry needling;Taping    PT Next Visit Plan review HEP; progress with pec stretch and posterior shoulder girdle strengthening; thoracic mobs; DN/manual work; postural correction; modalities as indicated    PT Home Exercise Plan [email or web address]    Consulted and Agree with Plan of Care Patient             Patient will benefit  from skilled therapeutic intervention in order to improve the following deficits and impairments:     Visit Diagnosis: Radiculopathy, cervical region  Other symptoms and signs involving the musculoskeletal system  Abnormal posture     Problem List Patient Active Problem List   Diagnosis Date Noted   Erectile  dysfunction 09/30/2021   SOB (shortness of breath) 09/16/2021   Numbness and tingling in left arm 09/16/2021   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 ([place]) 09/16/2021   History of MI (myocardial infarction) 09/16/2021   Epigastric pain 09/16/2021   Mid back pain 09/16/2021   Cervical radiculopathy 11/14/2020   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   Left knee pain 07/13/2018   Calcaneal spur of foot, right 10/30/2017   Neck pain 10/30/2017   Screening for AAA (abdominal aortic aneurysm) 10/30/2017   AAA (abdominal aortic aneurysm) 10/28/2017   Abnormal EKG 10/28/2017   Chest pain 10/28/2017   Primary osteoarthritis of left knee 10/28/2017   Left insertional Achilles tendinitis 10/28/2017   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, [place], [address] Phone: [phone]   Fax:  [phone]  Name: ADI FINNEGAN MRN: [id number] Date of Birth: [birth year]

## 2021-10-15 ENCOUNTER — Ambulatory Visit: Payer: Medicare Other | Admitting: Rehabilitative and Restorative Service Providers"

## 2021-10-15 ENCOUNTER — Other Ambulatory Visit: Payer: Self-pay

## 2021-10-15 ENCOUNTER — Encounter: Payer: Self-pay | Admitting: Rehabilitative and Restorative Service Providers"

## 2021-10-15 DIAGNOSIS — M5412 Radiculopathy, cervical region: Secondary | ICD-10-CM | POA: Diagnosis not present

## 2021-10-15 DIAGNOSIS — R293 Abnormal posture: Secondary | ICD-10-CM | POA: Diagnosis not present

## 2021-10-15 DIAGNOSIS — R29898 Other symptoms and signs involving the musculoskeletal system: Secondary | ICD-10-CM | POA: Diagnosis not present

## 2021-10-15 NOTE — Therapy (Signed)
[facility] [place] [address] [place], [place], [address] Phone: [phone]   Fax:  [phone]  Physical Therapy Treatment  Patient Details  Name: Jerome Chase MRN: [id number] Date of Birth: [birth year] Referring Provider (PT): Dr [name]   Encounter Date: 10/15/2021   PT End of Session - 10/15/21 0845     Visit Number 4    Number of Visits 12    Date for PT Re-Evaluation 11/13/21    PT Start Time 0844    PT Stop Time 0933    PT Time Calculation (min) 49 min    Activity Tolerance Patient tolerated treatment well             Past Medical History:  Diagnosis Date   AAA (abdominal aortic aneurysm) 10/28/2017   Abnormal EKG 10/28/2017   Coronary artery disease involving coronary bypass graft of native heart with angina pectoris (HCC) 05/20/2017   Dyslipidemia 05/28/2017   Essential hypertension 07/06/2015   Heart attack (HCC)    Hypertension    Status post coronary artery stent placement 05/28/2017    Past Surgical History:  Procedure Laterality Date   CARDIAC SURGERY      There were no vitals filed for this visit.   Subjective Assessment - 10/15/21 0845     Subjective Less pain in the neck and less numbness in the Lt arm - maybe once a day lasting 5-10 min (was happening 2-3 times a day and lasting longer) Having pain in the LB worse in the past 3-4 days. Notices it at night through the night. Therapy has helped.    Currently in Pain? Yes    Pain Score 3     Pain Location Neck    Pain Orientation Left    Pain Descriptors / Indicators Dull;Aching    Pain Type Chronic pain;Acute pain    Pain Onset More than a month ago    Pain Frequency Intermittent    Aggravating Factors  driving; working with head forward; turning neck                [facility] PT Assessment - 10/15/21 0001       Assessment   Medical Diagnosis Cervical radiculopathy    Referring Provider (PT) Dr [name]    Onset  Date/Surgical Date 08/17/21   neck pain for years   Hand Dominance Right    Next MD Visit 11/13/21    Prior Therapy here of knee 2019      AROM   Cervical Flexion 51 improving quality    Cervical Extension 36 discomfort; grinding    Cervical - Right Side Bend 28 tight    Cervical - Left Side Bend 20 tight some pain    Cervical - Right Rotation 42 tight    Cervical - Left Rotation 40 tight      Strength   Overall Strength Comments weakness in posterior shoulder girdle musculature - postural musculature      Palpation   Spinal mobility hypomobile thoracic and cervical spine with PA mobs    Palpation comment persistent muscular tightness Lt > Rt ant/lat/post cervical musculature; pecs; upper trap; cervical and thoracic paraspinals                           [facility] Adult PT Treatment/Exercise - 10/15/21 0001       Neuro Re-ed    Neuro Re-ed Details  working on posture and alignment - encouraging posterior  shoulder girdle activation      Shoulder Exercises: Seated   Other Seated Exercises thoracic extension sitting with coregeous ball at thoracic spine      Shoulder Exercises: Standing   Row Strengthening;Both;10 reps;Theraband    Theraband Level (Shoulder Row) Level 4 (Blue)    Row Limitations repeated x 5 with isometric hold blue TB    Retraction Strengthening;Both;10 reps;Theraband    Theraband Level (Shoulder Retraction) Level 2 (Red)    Retraction Limitations back along noodle    Other Standing Exercises chin tuck 5 sec x 5 reps; scap squeeze 5 sec x 10; L's x 10; W's x 10    Other Standing Exercises antirotation blue TB x 10 reps each side 3 sec hold      Shoulder Exercises: Stretch   Other Shoulder Stretches doorway stretch x 3 positions 20 sec 3 reps each position      Moist Heat Therapy   Number Minutes Moist Heat 10 Minutes    Moist Heat Location Cervical;Shoulder      Electrical Stimulation   Electrical Stimulation Location Bilat cervical and upper  trap    Electrical Stimulation Action TENS/mAmp x 5 min    Electrical Stimulation Parameters to tolerance    Electrical Stimulation Goals Pain;Tone      Manual Therapy   Manual therapy comments skilled palpation to assess response to Dn and manual work    Joint Mobilization thoracic and cervical lateral mobs Grade II/III    Soft tissue mobilization deep tissue work through ant/lat/post cervical musculature; upper traps; thoracic paraspinals    Myofascial Release upper traps bilat              Trigger Point Dry Needling - 10/15/21 0001     Consent Given? Yes    Education Handout Provided Previously provided    Dry Needling Comments bilat    Electrical Stimulation Performed with Dry Needling Yes    Other Dry Needling mAmp x 5 min    Upper Trapezius Response Palpable increased muscle length    Suboccipitals Response Palpable increased muscle length;Twitch response elicited    Scalenes Response Palpable increased muscle length    Semispinalis capitus Response Palpable increased muscle length;Twitch reponse elicited                   PT Education - 10/15/21 0900     Education Details HEP    Person(s) Educated Patient    Methods Explanation;Demonstration;Tactile cues;Verbal cues;Handout    Comprehension Verbalized understanding;Returned demonstration;Verbal cues required;Tactile cues required                 PT Long Term Goals - 10/02/21 1056       PT LONG TERM GOAL #1   Title Improve posture and alignment with patient to demonstrate improved upright posture with posterior shoulder girdle engaged    Time 6    Period Weeks    Status New    Target Date 11/13/21      PT LONG TERM GOAL #2   Title Decrease pain in the cervial spine and Lt UE allowing patient to perform functional activities with minimal pain and discomfort    Time 6    Period Weeks    Status New    Target Date 11/13/21      PT LONG TERM GOAL #3   Title Increase cervical ROM in lateral  flexion and rotation by 5-7 degrees with no pain    Time 6    Period Weeks  Status New    Target Date 11/13/21      PT LONG TERM GOAL #4   Title Imdependent in HEP    Time 6    Period Weeks    Status New    Target Date 11/13/21      PT LONG TERM GOAL #5   Title Improve functional limitation score to 57    Time 6    Period Weeks    Status New    Target Date 11/13/21                   Plan - 10/15/21 0858     Clinical Impression Statement Improving symptoms in neck and Lt UE. Increasing AROM in cervical spine and improving posture and alignment. Progressing with core stabilization and strengthening.    Rehab Potential Good    PT Frequency 2x / week    PT Duration 6 weeks    PT Treatment/Interventions ADLs/Self Care Home Management;Cryotherapy;Electrical Stimulation;Iontophoresis 4mg /ml Dexamethasone;Moist Heat;Ultrasound;Therapeutic activities;Therapeutic exercise;Balance training;Neuromuscular re-education;Patient/family education;Manual techniques;Dry needling;Taping    PT Next Visit Plan review HEP; progress with pec stretch and posterior shoulder girdle strengthening; thoracic mobs; DN/manual work; postural correction; modalities as indicated    PT Home Exercise Plan [email or web address]    Consulted and Agree with Plan of Care Patient             Patient will benefit from skilled therapeutic intervention in order to improve the following deficits and impairments:     Visit Diagnosis: Radiculopathy, cervical region  Other symptoms and signs involving the musculoskeletal system  Abnormal posture     Problem List Patient Active Problem List   Diagnosis Date Noted   Erectile dysfunction 09/30/2021   SOB (shortness of breath) 09/16/2021   Numbness and tingling in left arm 09/16/2021   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) 09/16/2021   History of MI (myocardial infarction) 09/16/2021   Epigastric pain 09/16/2021   Mid back pain  09/16/2021   Cervical radiculopathy 11/14/2020   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   Left knee pain 07/13/2018   Calcaneal spur of foot, right 10/30/2017   Neck pain 10/30/2017   Screening for AAA (abdominal aortic aneurysm) 10/30/2017   AAA (abdominal aortic aneurysm) 10/28/2017   Abnormal EKG 10/28/2017   Chest pain 10/28/2017   Primary osteoarthritis of left knee 10/28/2017   Left insertional Achilles tendinitis 10/28/2017   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 [place], [place], [place] Phone: [phone]   Fax:  [phone]  Name: STELIOS KIRBY MRN: Emilia Beck Date of Birth: 1951/02/06

## 2021-10-15 NOTE — Patient Instructions (Signed)
Access Code: [email or web address] URL: [email or web address] Date: 10/15/2021 Prepared by: Corlis Leak  Exercises Seated Cervical Retraction - 3 x daily - 7 x weekly - 1 sets - 10 reps Standing Scapular Retraction - 3 x daily - 7 x weekly - 1 sets - 10 reps - 10 hold Shoulder External Rotation and Scapular Retraction - 3 x daily - 7 x weekly - 1 sets - 10 reps - hold Shoulder External Rotation in 45 Degrees Abduction - 2 x daily - 7 x weekly - 1-2 sets - 10 reps - 3 sec hold Doorway Pec Stretch at 60 Degrees Abduction - 3 x daily - 7 x weekly - 1 sets - 3 reps Doorway Pec Stretch at 90 Degrees Abduction - 3 x daily - 7 x weekly - 1 sets - 3 reps - 30 seconds hold Doorway Pec Stretch at 120 Degrees Abduction - 3 x daily - 7 x weekly - 1 sets - 3 reps - 30 second hold hold Shoulder External Rotation and Scapular Retraction with Resistance - 2 x daily - 7 x weekly - 3 sets - 10 reps Scapular Retraction with Resistance - 2 x daily - 7 x weekly - 3 sets - 10 reps Standing Shoulder Row Reactive Isometric - 2 x daily - 7 x weekly - 1 sets - 10 reps - 3-5 sec hold Anti-Rotation Lateral Stepping with Press - 2 x daily - 7 x weekly - 1-2 sets - 10 reps - 2-3 sec hold

## 2021-10-17 ENCOUNTER — Ambulatory Visit: Payer: Medicare Other | Admitting: Rehabilitative and Restorative Service Providers"

## 2021-10-17 ENCOUNTER — Encounter: Payer: Self-pay | Admitting: Rehabilitative and Restorative Service Providers"

## 2021-10-17 ENCOUNTER — Other Ambulatory Visit: Payer: Self-pay

## 2021-10-17 DIAGNOSIS — R293 Abnormal posture: Secondary | ICD-10-CM

## 2021-10-17 DIAGNOSIS — R29898 Other symptoms and signs involving the musculoskeletal system: Secondary | ICD-10-CM | POA: Diagnosis not present

## 2021-10-17 DIAGNOSIS — M5412 Radiculopathy, cervical region: Secondary | ICD-10-CM

## 2021-10-17 NOTE — Therapy (Signed)
[facility] [place] [address] [place], [place], [address] Phone: [phone]   Fax:  [phone]  Physical Therapy Treatment  Patient Details  Name: Jerome Chase MRN: [id number] Date of Birth: 1951/03/10 Referring Provider (PT): Dr [name]   Encounter Date: 10/17/2021   PT End of Session - 10/17/21 0927     Visit Number 5    Number of Visits 12    Date for PT Re-Evaluation 11/13/21    PT Start Time 0928    PT Stop Time 1016    PT Time Calculation (min) 48 min    Activity Tolerance Patient tolerated treatment well             Past Medical History:  Diagnosis Date   AAA (abdominal aortic aneurysm) 10/28/2017   Abnormal EKG 10/28/2017   Coronary artery disease involving coronary bypass graft of native heart with angina pectoris (HCC) 05/20/2017   Dyslipidemia 05/28/2017   Essential hypertension 07/06/2015   Heart attack (HCC)    Hypertension    Status post coronary artery stent placement 05/28/2017    Past Surgical History:  Procedure Laterality Date   CARDIAC SURGERY      There were no vitals filed for this visit.   Subjective Assessment - 10/17/21 0929     Subjective Patient reports that he is having some pain in his back but not too much pain in the neck. [place] the [place] and did OK with that. He has not done his exercises. He has not had time to do them.    Currently in Pain? Yes    Pain Score 1     Pain Location Neck    Pain Orientation Left;Right    Pain Descriptors / Indicators Aching;Dull;Tightness    Pain Type Chronic pain;Acute pain    Pain Onset More than a month ago                               [facility] Adult PT Treatment/Exercise - 10/17/21 0001       Neuro Re-ed    Neuro Re-ed Details  working on posture and alignment - encouraging posterior shoulder girdle activation      Shoulder Exercises: Seated   Other Seated Exercises thoracic  extension sitting with coregeous ball at thoracic spine      Shoulder Exercises: Standing   Row Strengthening;Both;10 reps;Theraband    Theraband Level (Shoulder Row) Level 4 (Blue)    Row Limitations repeated x 5 with 20 sec isometric hold blue TB    Retraction Strengthening;Both;10 reps;Theraband    Theraband Level (Shoulder Retraction) Level 2 (Red)    Other Standing Exercises antirotation blue TB x 10 reps each side 3 sec hold      Shoulder Exercises: Stretch   Other Shoulder Stretches doorway stretch x 3 positions 20 sec 3 reps each position    Other Shoulder Stretches shoulder flexion hands on top of door frame stepping through the doorway to stretch into shoulder flexion - 30 sec x 2 reps      Moist Heat Therapy   Number Minutes Moist Heat 10 Minutes    Moist Heat Location Cervical;Shoulder      Electrical Stimulation   Electrical Stimulation Location Bilat cervical and upper trap    Electrical Stimulation Action TENS    Electrical Stimulation Parameters to tolerance    Electrical Stimulation Goals Pain;Tone  Manual Therapy   Manual therapy comments skilled palpation to assess response to Dn and manual work    Joint Mobilization thoracic and cervical lateral mobs Grade II/III    Soft tissue mobilization deep tissue work through ant/lat/post cervical musculature; upper traps; thoracic paraspinals    Myofascial Release upper traps bilat              Trigger Point Dry Needling - 10/17/21 0001     Consent Given? Yes    Education Handout Provided Previously provided    Dry Needling Comments bilat    Upper Trapezius Response Palpable increased muscle length    Suboccipitals Response Palpable increased muscle length;Twitch response elicited    Semispinalis capitus Response Palpable increased muscle length;Twitch reponse elicited    Cervical multifidi Response Palpable increased muscle length                   PT Education - 10/17/21 0953     Education  Details HEP    Person(s) Educated Patient    Methods Explanation;Demonstration;Tactile cues;Verbal cues;Handout    Comprehension Verbalized understanding;Returned demonstration;Verbal cues required;Tactile cues required                 PT Long Term Goals - 10/02/21 1056       PT LONG TERM GOAL #1   Title Improve posture and alignment with patient to demonstrate improved upright posture with posterior shoulder girdle engaged    Time 6    Period Weeks    Status New    Target Date 11/13/21      PT LONG TERM GOAL #2   Title Decrease pain in the cervial spine and Lt UE allowing patient to perform functional activities with minimal pain and discomfort    Time 6    Period Weeks    Status New    Target Date 11/13/21      PT LONG TERM GOAL #3   Title Increase cervical ROM in lateral flexion and rotation by 5-7 degrees with no pain    Time 6    Period Weeks    Status New    Target Date 11/13/21      PT LONG TERM GOAL #4   Title Imdependent in HEP    Time 6    Period Weeks    Status New    Target Date 11/13/21      PT LONG TERM GOAL #5   Title Improve functional limitation score to 57    Time 6    Period Weeks    Status New    Target Date 11/13/21                   Plan - 10/17/21 0933     Clinical Impression Statement Less pain and tightness in the neck and fewer symptoms in the Lt UE. No radicular symptoms in the past few days. Reviewed exercises. No new exercises today since patient has not had a chance to do the previous exercises. Continued positive response to DN and manual work.    Rehab Potential Good    PT Frequency 2x / week    PT Duration 6 weeks    PT Treatment/Interventions ADLs/Self Care Home Management;Cryotherapy;Electrical Stimulation;Iontophoresis 4mg /ml Dexamethasone;Moist Heat;Ultrasound;Therapeutic activities;Therapeutic exercise;Balance training;Neuromuscular re-education;Patient/family education;Manual techniques;Dry needling;Taping     PT Next Visit Plan review HEP; progress with pec stretch and posterior shoulder girdle strengthening; thoracic mobs; DN/manual work; postural correction; modalities as indicated    PT Home Exercise Plan  [email or web address]    Consulted and Agree with Plan of Care Patient             Patient will benefit from skilled therapeutic intervention in order to improve the following deficits and impairments:     Visit Diagnosis: Radiculopathy, cervical region  Other symptoms and signs involving the musculoskeletal system  Abnormal posture     Problem List Patient Active Problem List   Diagnosis Date Noted   Erectile dysfunction 09/30/2021   SOB (shortness of breath) 09/16/2021   Numbness and tingling in left arm 09/16/2021   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) 09/16/2021   History of MI (myocardial infarction) 09/16/2021   Epigastric pain 09/16/2021   Mid back pain 09/16/2021   Cervical radiculopathy 11/14/2020   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   Left knee pain 07/13/2018   Calcaneal spur of foot, right 10/30/2017   Neck pain 10/30/2017   Screening for AAA (abdominal aortic aneurysm) 10/30/2017   AAA (abdominal aortic aneurysm) 10/28/2017   Abnormal EKG 10/28/2017   Chest pain 10/28/2017   Primary osteoarthritis of left knee 10/28/2017   Left insertional Achilles tendinitis 10/28/2017   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   Tailor's bunion of both feet 09/20/2017   Ingrown nail 09/18/2017   Tenosynovitis of right foot 09/18/2017   Chronic heel pain, right 09/18/2017   Dyslipidemia 05/28/2017   Status post coronary artery stent placement 05/28/2017   Abnormal chest CT 05/28/2017   Coronary artery disease involving coronary bypass graft of native heart with angina pectoris (HCC) 05/20/2017   Insomnia 07/16/2015   Essential hypertension 07/06/2015   Dyspnea on effort 07/06/2015    Kathren Scearce Rober Minion, PT, MPH  10/17/2021, 10:17 AM  [facility] [address] [place], [place], [address] Phone: [phone]   Fax:  [phone]  Name: TREVONTAE LINDAHL MRN: [id number] Date of Birth: [birth year]

## 2021-10-17 NOTE — Patient Instructions (Signed)
Access Code: [email or web address] URL: [email or web address] Date: 10/17/2021 Prepared by: Gillermo Murdoch  Exercises Seated Cervical Retraction - 3 x daily - 7 x weekly - 1 sets - 10 reps Standing Scapular Retraction - 3 x daily - 7 x weekly - 1 sets - 10 reps - 10 hold Shoulder External Rotation and Scapular Retraction - 3 x daily - 7 x weekly - 1 sets - 10 reps - hold Shoulder External Rotation in 45 Degrees Abduction - 2 x daily - 7 x weekly - 1-2 sets - 10 reps - 3 sec hold Doorway Pec Stretch at 60 Degrees Abduction - 3 x daily - 7 x weekly - 1 sets - 3 reps Doorway Pec Stretch at 90 Degrees Abduction - 3 x daily - 7 x weekly - 1 sets - 3 reps - 30 seconds hold Doorway Pec Stretch at 120 Degrees Abduction - 3 x daily - 7 x weekly - 1 sets - 3 reps - 30 second hold hold Shoulder External Rotation and Scapular Retraction with Resistance - 2 x daily - 7 x weekly - 3 sets - 10 reps Scapular Retraction with Resistance - 2 x daily - 7 x weekly - 3 sets - 10 reps Standing Shoulder Row Reactive Isometric - 2 x daily - 7 x weekly - 1 sets - 10 reps - 3-5 sec hold Anti-Rotation Lateral Stepping with Press - 2 x daily - 7 x weekly - 1-2 sets - 10 reps - 2-3 sec hold Standing Shoulder Flexion Wall Walk - 2 x daily - 7 x weekly - 1 sets - 3 reps - 30 sec hold

## 2021-10-22 ENCOUNTER — Ambulatory Visit: Payer: Medicare Other | Admitting: Rehabilitative and Restorative Service Providers"

## 2021-10-24 ENCOUNTER — Ambulatory Visit: Payer: Medicare Other | Admitting: Rehabilitative and Restorative Service Providers"

## 2021-10-29 ENCOUNTER — Ambulatory Visit: Payer: Medicare Other | Admitting: Rehabilitative and Restorative Service Providers"

## 2021-10-29 ENCOUNTER — Other Ambulatory Visit: Payer: Self-pay

## 2021-10-29 ENCOUNTER — Encounter: Payer: Self-pay | Admitting: Rehabilitative and Restorative Service Providers"

## 2021-10-29 DIAGNOSIS — R29898 Other symptoms and signs involving the musculoskeletal system: Secondary | ICD-10-CM

## 2021-10-29 DIAGNOSIS — R293 Abnormal posture: Secondary | ICD-10-CM | POA: Diagnosis not present

## 2021-10-29 DIAGNOSIS — M5412 Radiculopathy, cervical region: Secondary | ICD-10-CM | POA: Diagnosis not present

## 2021-10-29 NOTE — Patient Instructions (Signed)
Access Code: [email or web address] URL: [email or web address] Date: 10/29/2021 Prepared by: Corlis Leak  Exercises Seated Cervical Retraction - 3 x daily - 7 x weekly - 1 sets - 10 reps Standing Scapular Retraction - 3 x daily - 7 x weekly - 1 sets - 10 reps - 10 hold Shoulder External Rotation and Scapular Retraction - 3 x daily - 7 x weekly - 1 sets - 10 reps - hold Shoulder External Rotation in 45 Degrees Abduction - 2 x daily - 7 x weekly - 1-2 sets - 10 reps - 3 sec hold Doorway Pec Stretch at 60 Degrees Abduction - 3 x daily - 7 x weekly - 1 sets - 3 reps Doorway Pec Stretch at 90 Degrees Abduction - 3 x daily - 7 x weekly - 1 sets - 3 reps - 30 seconds hold Doorway Pec Stretch at 120 Degrees Abduction - 3 x daily - 7 x weekly - 1 sets - 3 reps - 30 second hold hold Shoulder External Rotation and Scapular Retraction with Resistance - 2 x daily - 7 x weekly - 3 sets - 10 reps Scapular Retraction with Resistance - 2 x daily - 7 x weekly - 3 sets - 10 reps Standing Shoulder Row Reactive Isometric - 2 x daily - 7 x weekly - 1 sets - 10 reps - 3-5 sec hold Anti-Rotation Lateral Stepping with Press - 2 x daily - 7 x weekly - 1-2 sets - 10 reps - 2-3 sec hold Standing Shoulder Flexion Wall Walk - 2 x daily - 7 x weekly - 1 sets - 3 reps - 30 sec hold

## 2021-10-29 NOTE — Therapy (Signed)
[facility] [place] [address] [place], [place], [address] Phone: [phone]   Fax:  [phone]  Physical Therapy Treatment  Patient Details  Name: Jerome Chase MRN: [id number] Date of Birth: Dec 05, 1950 Referring Provider (PT): Dr [name]   Encounter Date: 10/29/2021   PT End of Session - 10/29/21 0847     Visit Number 6    Number of Visits 12    Date for PT Re-Evaluation 11/13/21    PT Start Time 0845    PT Stop Time 0933    PT Time Calculation (min) 48 min    Activity Tolerance Patient tolerated treatment well             Past Medical History:  Diagnosis Date   AAA (abdominal aortic aneurysm) 10/28/2017   Abnormal EKG 10/28/2017   Coronary artery disease involving coronary bypass graft of native heart with angina pectoris (HCC) 05/20/2017   Dyslipidemia 05/28/2017   Essential hypertension 07/06/2015   Heart attack (HCC)    Hypertension    Status post coronary artery stent placement 05/28/2017    Past Surgical History:  Procedure Laterality Date   CARDIAC SURGERY      There were no vitals filed for this visit.   Subjective Assessment - 10/29/21 0848     Subjective Had COVID last week. Was in the bed for a couple of days and has been up and about for the past few days. He is not having pain or symptoms in the Lt arm but is still having tightness and pain in the neck.    Currently in Pain? Yes    Pain Score 2     Pain Location Neck    Pain Orientation Left;Right    Pain Descriptors / Indicators Aching;Numbness    Pain Type Acute pain;Chronic pain                [facility] PT Assessment - 10/29/21 0001       Assessment   Medical Diagnosis Cervical radiculopathy    Referring Provider (PT) Dr [name]    Onset Date/Surgical Date 08/17/21   neck pain for years   Hand Dominance Right    Next MD Visit 11/13/21    Prior Therapy here of knee 2019      AROM   Cervical Flexion 64    Cervical  Extension 35 discomfort; grinding    Cervical - Right Side Bend 21 tight    Cervical - Left Side Bend 18 tight, pain    Cervical - Right Rotation 39 tight    Cervical - Left Rotation 42 tight      Strength   Overall Strength Comments weakness in posterior shoulder girdle musculature - postural musculature      Palpation   Spinal mobility hypomobile thoracic and cervical spine with PA mobs    Palpation comment persistent muscular tightness Lt > Rt ant/lat/post cervical musculature; pecs; upper trap; cervical and thoracic paraspinals                           [facility] Adult PT Treatment/Exercise - 10/29/21 0001       Neuro Re-ed    Neuro Re-ed Details  working on posture and alignment - encouraging posterior shoulder girdle activation      Shoulder Exercises: Seated   Other Seated Exercises thoracic extension sitting with coregeous ball at thoracic spine      Shoulder Exercises: Standing   Row Strengthening;Both;10  reps;Theraband    Theraband Level (Shoulder Row) Level 4 (Blue)    Row Limitations repeated x 5 with 20 sec isometric hold blue TB    Retraction Strengthening;Both;10 reps;Theraband    Theraband Level (Shoulder Retraction) Level 2 (Red)    Other Standing Exercises antirotation blue TB x 10 reps each side 3 sec hold      Shoulder Exercises: Stretch   Other Shoulder Stretches doorway stretch x 3 positions 20 sec 3 reps each position    Other Shoulder Stretches shoulder flexion hands on top of door frame stepping through the doorway to stretch into shoulder flexion - 30 sec x 2 reps      Moist Heat Therapy   Number Minutes Moist Heat 10 Minutes    Moist Heat Location Cervical;Shoulder      Manual Therapy   Manual therapy comments skilled palpation to assess response to Dn and manual work    Joint Mobilization thoracic and cervical lateral mobs Grade II/III    Soft tissue mobilization deep tissue work through ant/lat/post cervical musculature; upper traps;  thoracic paraspinals    Myofascial Release upper traps bilat              Trigger Point Dry Needling - 10/29/21 0001     Consent Given? Yes    Education Handout Provided Previously provided    Dry Needling Comments bilat    Electrical Stimulation Performed with Dry Needling Yes    Other Dry Needling mAmp x 5 min    Suboccipitals Response Palpable increased muscle length;Twitch response elicited    Scalenes Response Palpable increased muscle length    Semispinalis capitus Response Palpable increased muscle length;Twitch reponse elicited    Cervical multifidi Response Palpable increased muscle length    Thoracic multifidi response Palpable increased muscle length;Twitch response elicited                   PT Education - 10/29/21 0859     Education Details HEP    Person(s) Educated Patient    Methods Explanation;Demonstration;Tactile cues;Verbal cues;Handout    Comprehension Verbalized understanding;Returned demonstration;Verbal cues required;Tactile cues required                 PT Long Term Goals - 10/02/21 1056       PT LONG TERM GOAL #1   Title Improve posture and alignment with patient to demonstrate improved upright posture with posterior shoulder girdle engaged    Time 6    Period Weeks    Status New    Target Date 11/13/21      PT LONG TERM GOAL #2   Title Decrease pain in the cervial spine and Lt UE allowing patient to perform functional activities with minimal pain and discomfort    Time 6    Period Weeks    Status New    Target Date 11/13/21      PT LONG TERM GOAL #3   Title Increase cervical ROM in lateral flexion and rotation by 5-7 degrees with no pain    Time 6    Period Weeks    Status New    Target Date 11/13/21      PT LONG TERM GOAL #4   Title Imdependent in HEP    Time 6    Period Weeks    Status New    Target Date 11/13/21      PT LONG TERM GOAL #5   Title Improve functional limitation score to 57  Time 6    Period  Weeks    Status New    Target Date 11/13/21                   Plan - 10/29/21 0853     Clinical Impression Statement Patient has been sick with COVID for the past week or so. He reports resolution of the Lt UE pain but continued pain and tightness in the neck. Patient reviewed exercises in clinic. Continued with manual work and modalities. Patient will benefit from continued treatment to address problems in the cervical spine.    Rehab Potential Good    PT Frequency 2x / week    PT Duration 6 weeks    PT Treatment/Interventions ADLs/Self Care Home Management;Cryotherapy;Electrical Stimulation;Iontophoresis 4mg /ml Dexamethasone;Moist Heat;Ultrasound;Therapeutic activities;Therapeutic exercise;Balance training;Neuromuscular re-education;Patient/family education;Manual techniques;Dry needling;Taping    PT Next Visit Plan review HEP; progress with pec stretch and posterior shoulder girdle strengthening; thoracic mobs; DN/manual work; postural correction; modalities as indicated    PT Home Exercise Plan [email or web address]    Consulted and Agree with Plan of Care Patient             Patient will benefit from skilled therapeutic intervention in order to improve the following deficits and impairments:     Visit Diagnosis: Radiculopathy, cervical region  Other symptoms and signs involving the musculoskeletal system  Abnormal posture     Problem List Patient Active Problem List   Diagnosis Date Noted   Erectile dysfunction 09/30/2021   SOB (shortness of breath) 09/16/2021   Numbness and tingling in left arm 09/16/2021   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) 09/16/2021   History of MI (myocardial infarction) 09/16/2021   Epigastric pain 09/16/2021   Mid back pain 09/16/2021   Cervical radiculopathy 11/14/2020   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   Left knee pain 07/13/2018   Calcaneal spur of foot, right 10/30/2017   Neck pain 10/30/2017   Screening for  AAA (abdominal aortic aneurysm) 10/30/2017   AAA (abdominal aortic aneurysm) 10/28/2017   Abnormal EKG 10/28/2017   Chest pain 10/28/2017   Primary osteoarthritis of left knee 10/28/2017   Left insertional Achilles tendinitis 10/28/2017   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   Tailor's bunion of both feet 09/20/2017   Ingrown nail 09/18/2017   Tenosynovitis of right foot 09/18/2017   Chronic heel pain, right 09/18/2017   Dyslipidemia 05/28/2017   Status post coronary artery stent placement 05/28/2017   Abnormal chest CT 05/28/2017   Coronary artery disease involving coronary bypass graft of native heart with angina pectoris (HCC) 05/20/2017   Insomnia 07/16/2015   Essential hypertension 07/06/2015   Dyspnea on effort 07/06/2015    Lorry Furber 07/08/2015, PT, MPH  10/29/2021, 9:22 AM  [facility] [address] [place], [place], [place] Phone: [phone]   Fax:  [phone]  Name: ARAFAT COCUZZA MRN: Emilia Beck Date of Birth: [birth year]

## 2021-10-31 ENCOUNTER — Encounter: Payer: Self-pay | Admitting: Rehabilitative and Restorative Service Providers"

## 2021-10-31 ENCOUNTER — Ambulatory Visit: Payer: Medicare Other | Admitting: Rehabilitative and Restorative Service Providers"

## 2021-10-31 ENCOUNTER — Other Ambulatory Visit: Payer: Self-pay

## 2021-10-31 DIAGNOSIS — M5412 Radiculopathy, cervical region: Secondary | ICD-10-CM | POA: Diagnosis not present

## 2021-10-31 DIAGNOSIS — R29898 Other symptoms and signs involving the musculoskeletal system: Secondary | ICD-10-CM

## 2021-10-31 DIAGNOSIS — R293 Abnormal posture: Secondary | ICD-10-CM | POA: Diagnosis not present

## 2021-10-31 NOTE — Therapy (Signed)
[facility] [place] [address] [place], [place], [address] Phone: [phone]   Fax:  [phone]  Physical Therapy Treatment  Patient Details  Name: Jerome Chase MRN: [id number] Date of Birth: [birth year] Referring Provider (PT): Dr [name]   Encounter Date: 10/31/2021   PT End of Session - 10/31/21 0845     Visit Number 7    Number of Visits 12    Date for PT Re-Evaluation 11/13/21    PT Start Time 0845    PT Stop Time 0933    PT Time Calculation (min) 48 min    Activity Tolerance Patient tolerated treatment well             Past Medical History:  Diagnosis Date   AAA (abdominal aortic aneurysm) 10/28/2017   Abnormal EKG 10/28/2017   Coronary artery disease involving coronary bypass graft of native heart with angina pectoris (HCC) 05/20/2017   Dyslipidemia 05/28/2017   Essential hypertension 07/06/2015   Heart attack (HCC)    Hypertension    Status post coronary artery stent placement 05/28/2017    Past Surgical History:  Procedure Laterality Date   CARDIAC SURGERY      There were no vitals filed for this visit.   Subjective Assessment - 10/31/21 0846     Subjective Neck feels 'a little bit better'.No symptoms in the Lt UE. Sometimes falls asleep in the chair watching the news and knows that irritates the neck.    Currently in Pain? Yes    Pain Score 2     Pain Location Neck    Pain Orientation Left;Right    Pain Descriptors / Indicators Tightness;Aching    Pain Type Acute pain;Chronic pain    Pain Onset More than a month ago    Pain Frequency Intermittent                               [facility] Adult PT Treatment/Exercise - 10/31/21 0001       Neuro Re-ed    Neuro Re-ed Details  working on posture and alignment - encouraging posterior shoulder girdle activation      Shoulder Exercises: Seated   Other Seated Exercises thoracic extension sitting with coregeous ball at  thoracic spine    Other Seated Exercises axial extension 10 sec x 5 reps sitting and standing; upper trap stretch sitting 10 sec x 3 reps      Shoulder Exercises: Stretch   Other Shoulder Stretches doorway stretch x 3 positions 20 sec 3 reps each position    Other Shoulder Stretches shoulder flexion hands on top of door frame stepping through the doorway to stretch into shoulder flexion - 30 sec x 2 reps      Moist Heat Therapy   Number Minutes Moist Heat 10 Minutes    Moist Heat Location Cervical;Shoulder      Electrical Stimulation   Electrical Stimulation Location Lt cervical and upper trap    Electrical Stimulation Action TENS    Electrical Stimulation Parameters to tolerance    Electrical Stimulation Goals Pain;Tone      Manual Therapy   Manual therapy comments skilled palpation to assess response to Dn and manual work    Joint Mobilization cervical lateral mobs Grade II/III    Soft tissue mobilization deep tissue work through ant/lat/post cervical musculature; upper traps    Myofascial Release anterior chest  Trigger Point Dry Needling - 10/31/21 0001     Consent Given? Yes    Education Handout Provided Previously provided    Dry Needling Comments Lt    Sternocleidomastoid Response Palpable increased muscle length;Twitch response elicited    Upper Trapezius Response Palpable increased muscle length;Twitch reponse elicited    Scalenes Response Palpable increased muscle length;Twitch reponse elicited    Cervical multifidi Response Palpable increased muscle length;Twitch reponse elicited                   PT Education - 10/31/21 0900     Education Details HEP    Person(s) Educated Patient    Methods Explanation;Demonstration;Tactile cues;Verbal cues;Handout    Comprehension Verbalized understanding;Returned demonstration;Verbal cues required;Tactile cues required                 PT Long Term Goals - 10/02/21 1056       PT LONG TERM  GOAL #1   Title Improve posture and alignment with patient to demonstrate improved upright posture with posterior shoulder girdle engaged    Time 6    Period Weeks    Status New    Target Date 11/13/21      PT LONG TERM GOAL #2   Title Decrease pain in the cervial spine and Lt UE allowing patient to perform functional activities with minimal pain and discomfort    Time 6    Period Weeks    Status New    Target Date 11/13/21      PT LONG TERM GOAL #3   Title Increase cervical ROM in lateral flexion and rotation by 5-7 degrees with no pain    Time 6    Period Weeks    Status New    Target Date 11/13/21      PT LONG TERM GOAL #4   Title Imdependent in HEP    Time 6    Period Weeks    Status New    Target Date 11/13/21      PT LONG TERM GOAL #5   Title Improve functional limitation score to 57    Time 6    Period Weeks    Status New    Target Date 11/13/21                   Plan - 10/31/21 0849     Clinical Impression Statement Good resolution of cervical radiculopathy but continued tightness and discomfort in the Lt > Rt neck. Note significant muscular tightness Lt cervical. Good response to Dn and manual work followed by modalities,    Rehab Potential Good    PT Frequency 2x / week    PT Duration 6 weeks    PT Treatment/Interventions ADLs/Self Care Home Management;Cryotherapy;Electrical Stimulation;Iontophoresis 4mg /ml Dexamethasone;Moist Heat;Ultrasound;Therapeutic activities;Therapeutic exercise;Balance training;Neuromuscular re-education;Patient/family education;Manual techniques;Dry needling;Taping    PT Next Visit Plan review HEP; progress with pec stretch and posterior shoulder girdle strengthening; thoracic mobs; DN/manual work; postural correction; modalities as indicated    PT Home Exercise Plan [email or web address]    Consulted and Agree with Plan of Care Patient             Patient will benefit from skilled therapeutic intervention in order to improve the  following deficits and impairments:     Visit Diagnosis: Radiculopathy, cervical region  Other symptoms and signs involving the musculoskeletal system  Abnormal posture     Problem List Patient Active Problem List   Diagnosis Date Noted   Erectile dysfunction  09/30/2021   SOB (shortness of breath) 09/16/2021   Numbness and tingling in left arm 09/16/2021   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) 09/16/2021   History of MI (myocardial infarction) 09/16/2021   Epigastric pain 09/16/2021   Mid back pain 09/16/2021   Cervical radiculopathy 11/14/2020   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   Left knee pain 07/13/2018   Calcaneal spur of foot, right 10/30/2017   Neck pain 10/30/2017   Screening for AAA (abdominal aortic aneurysm) 10/30/2017   AAA (abdominal aortic aneurysm) 10/28/2017   Abnormal EKG 10/28/2017   Chest pain 10/28/2017   Primary osteoarthritis of left knee 10/28/2017   Left insertional Achilles tendinitis 10/28/2017   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   Tailor's bunion of both feet 09/20/2017   Ingrown nail 09/18/2017   Tenosynovitis of right foot 09/18/2017   Chronic heel pain, right 09/18/2017   Dyslipidemia 05/28/2017   Status post coronary artery stent placement 05/28/2017   Abnormal chest CT 05/28/2017   Coronary artery disease involving coronary bypass graft of native heart with angina pectoris (HCC) 05/20/2017   Insomnia 07/16/2015   Essential hypertension 07/06/2015   Dyspnea on effort 07/06/2015    Janie Strothman Rober Minion, PT, MPH 10/31/2021, 9:31 AM  [facility] [address] [place] [address] [place], [place], [address] Phone: [phone]   Fax:  [phone]  Name: CASPER PAGLIUCA MRN: [id number] Date of Birth: 1950-10-27

## 2021-10-31 NOTE — Patient Instructions (Signed)
Access Code: [email or web address] URL: [email or web address] Date: 10/31/2021 Prepared by: Corlis Leak  Exercises Seated Cervical Retraction - 3 x daily - 7 x weekly - 1 sets - 10 reps Standing Scapular Retraction - 3 x daily - 7 x weekly - 1 sets - 10 reps - 10 hold Shoulder External Rotation and Scapular Retraction - 3 x daily - 7 x weekly - 1 sets - 10 reps - hold Shoulder External Rotation in 45 Degrees Abduction - 2 x daily - 7 x weekly - 1-2 sets - 10 reps - 3 sec hold Doorway Pec Stretch at 60 Degrees Abduction - 3 x daily - 7 x weekly - 1 sets - 3 reps Doorway Pec Stretch at 90 Degrees Abduction - 3 x daily - 7 x weekly - 1 sets - 3 reps - 30 seconds hold Doorway Pec Stretch at 120 Degrees Abduction - 3 x daily - 7 x weekly - 1 sets - 3 reps - 30 second hold hold Shoulder External Rotation and Scapular Retraction with Resistance - 2 x daily - 7 x weekly - 3 sets - 10 reps Scapular Retraction with Resistance - 2 x daily - 7 x weekly - 3 sets - 10 reps Standing Shoulder Row Reactive Isometric - 2 x daily - 7 x weekly - 1 sets - 10 reps - 3-5 sec hold Anti-Rotation Lateral Stepping with Press - 2 x daily - 7 x weekly - 1-2 sets - 10 reps - 2-3 sec hold Standing Shoulder Flexion Wall Walk - 2 x daily - 7 x weekly - 1 sets - 3 reps - 30 sec hold Seated Cervical Sidebending AROM - 2 x daily - 7 x weekly - 1 sets - 5 reps - 5-10 sec hold Seated Upper Trapezius Stretch - 2 x daily - 7 x weekly - 1 sets - 3 reps - 5-10 sec hold Upper Trapezius Stretch - 2 x daily - 7 x weekly - 1 sets - 3 reps - 5-10 sec hold

## 2021-11-05 ENCOUNTER — Other Ambulatory Visit: Payer: Self-pay

## 2021-11-05 ENCOUNTER — Encounter: Payer: Self-pay | Admitting: Rehabilitative and Restorative Service Providers"

## 2021-11-05 ENCOUNTER — Ambulatory Visit: Payer: Medicare Other | Admitting: Rehabilitative and Restorative Service Providers"

## 2021-11-05 DIAGNOSIS — R29898 Other symptoms and signs involving the musculoskeletal system: Secondary | ICD-10-CM

## 2021-11-05 DIAGNOSIS — R293 Abnormal posture: Secondary | ICD-10-CM | POA: Diagnosis not present

## 2021-11-05 DIAGNOSIS — M5412 Radiculopathy, cervical region: Secondary | ICD-10-CM | POA: Diagnosis not present

## 2021-11-05 NOTE — Therapy (Signed)
[facility] [place] [address] [place] [place], [place], [address] Phone: [phone]   Fax:  [phone]  Physical Therapy Treatment  Patient Details  Name: Jerome Chase MRN: [id number] Date of Birth: 08/02/1951 Referring Provider (PT): Dr [name]   Encounter Date: 11/05/2021   PT End of Session - 11/05/21 0850     Visit Number 8    Number of Visits 12    Date for PT Re-Evaluation 11/13/21    PT Start Time 0846    PT Stop Time 0934    PT Time Calculation (min) 48 min    Activity Tolerance Patient tolerated treatment well             Past Medical History:  Diagnosis Date   AAA (abdominal aortic aneurysm) 10/28/2017   Abnormal EKG 10/28/2017   Coronary artery disease involving coronary bypass graft of native heart with angina pectoris (HCC) 05/20/2017   Dyslipidemia 05/28/2017   Essential hypertension 07/06/2015   Heart attack (HCC)    Hypertension    Status post coronary artery stent placement 05/28/2017    Past Surgical History:  Procedure Laterality Date   CARDIAC SURGERY      There were no vitals filed for this visit.   Subjective Assessment - 11/05/21 0850     Subjective Neck is still stiff and he has trouble turning his head to the Lt. No painin the Lt UE for more than a week. Working on some of the exercises at home.    Currently in Pain? Yes    Pain Score 1     Pain Location Neck    Pain Orientation Left;Right    Pain Descriptors / Indicators Tightness   stiffness in the neck               [facility] PT Assessment - 11/05/21 0001       Assessment   Medical Diagnosis Cervical radiculopathy    Referring Provider (PT) Dr [name]    Onset Date/Surgical Date 08/17/21   neck pain for years   Hand Dominance Right    Next MD Visit 11/13/21    Prior Therapy here of knee 2019      AROM   Cervical - Right Rotation 43 tight    Cervical - Left Rotation 42 tight      Palpation   Spinal mobility  hypomobile thoracic and cervical spine with PA mobs    Palpation comment persistent muscular tightness Lt > Rt ant/lat/post cervical musculature; pecs; upper trap; cervical and thoracic paraspinals                           [facility] Adult PT Treatment/Exercise - 11/05/21 0001       Neuro Re-ed    Neuro Re-ed Details  working on posture and alignment - encouraging posterior shoulder girdle activation      Shoulder Exercises: Seated   Other Seated Exercises thoracic extension sitting with coregeous ball at thoracic spine    Other Seated Exercises axial extension 10 sec x 5 reps sitting and standing; upper trap stretch sitting 10 sec x 3 reps      Shoulder Exercises: Stretch   Other Shoulder Stretches doorway stretch x 3 positions 20 sec 3 reps each position    Other Shoulder Stretches shoulder flexion hands on top of door frame stepping through the doorway to stretch into shoulder flexion - 30 sec x 2 reps  Moist Heat Therapy   Number Minutes Moist Heat 10 Minutes    Moist Heat Location Cervical;Shoulder      Electrical Stimulation   Electrical Stimulation Location Lt cervical and upper trap    Electrical Stimulation Action TENS    Electrical Stimulation Parameters to tolerance    Electrical Stimulation Goals Pain;Tone      Manual Therapy   Manual therapy comments skilled palpation to assess response to Dn and manual work    Joint Mobilization cervical lateral mobs Grade II/III    Soft tissue mobilization deep tissue work through ant/lat/post cervical musculature; upper traps    Myofascial Release anterior chest              Trigger Point Dry Needling - 11/05/21 0001     Consent Given? Yes    Education Handout Provided Previously provided    Dry Needling Comments Lt/ Rt thoracic paraspinals    Electrical Stimulation Performed with Dry Needling Yes    Other Dry Needling mAmp x 5 min    Upper Trapezius Response Palpable increased muscle length;Twitch  reponse elicited    Suboccipitals Response Palpable increased muscle length;Twitch response elicited    Scalenes Response Palpable increased muscle length;Twitch reponse elicited    Cervical multifidi Response Palpable increased muscle length;Twitch reponse elicited    Thoracic multifidi response Palpable increased muscle length;Twitch response elicited                        PT Long Term Goals - 10/02/21 1056       PT LONG TERM GOAL #1   Title Improve posture and alignment with patient to demonstrate improved upright posture with posterior shoulder girdle engaged    Time 6    Period Weeks    Status New    Target Date 11/13/21      PT LONG TERM GOAL #2   Title Decrease pain in the cervial spine and Lt UE allowing patient to perform functional activities with minimal pain and discomfort    Time 6    Period Weeks    Status New    Target Date 11/13/21      PT LONG TERM GOAL #3   Title Increase cervical ROM in lateral flexion and rotation by 5-7 degrees with no pain    Time 6    Period Weeks    Status New    Target Date 11/13/21      PT LONG TERM GOAL #4   Title Imdependent in HEP    Time 6    Period Weeks    Status New    Target Date 11/13/21      PT LONG TERM GOAL #5   Title Improve functional limitation score to 57    Time 6    Period Weeks    Status New    Target Date 11/13/21                   Plan - 11/05/21 5465     Clinical Impression Statement Patient reports continued progress with symptoms. Good resolution of numbness in the Lt UE. Continues to have stiffness in the cervical spine. Demonstrates slight improvement in cervical rotation; continued muscular tightness through the thoracic and cervical musculature. Gradual progress toward stated goals of therapy.    Rehab Potential Good    PT Frequency 2x / week    PT Duration 6 weeks    PT Treatment/Interventions ADLs/Self Care Home Management;Cryotherapy;Electrical  Stimulation;Iontophoresis  4mg /ml Dexamethasone;Moist Heat;Ultrasound;Therapeutic activities;Therapeutic exercise;Balance training;Neuromuscular re-education;Patient/family education;Manual techniques;Dry needling;Taping    PT Next Visit Plan review HEP; progress with pec stretch and posterior shoulder girdle strengthening; thoracic mobs; DN/manual work; postural correction; modalities as indicated    PT Home Exercise Plan [email or web address]    Consulted and Agree with Plan of Care Patient             Patient will benefit from skilled therapeutic intervention in order to improve the following deficits and impairments:     Visit Diagnosis: Radiculopathy, cervical region  Other symptoms and signs involving the musculoskeletal system  Abnormal posture     Problem List Patient Active Problem List   Diagnosis Date Noted   Erectile dysfunction 09/30/2021   SOB (shortness of breath) 09/16/2021   Numbness and tingling in left arm 09/16/2021   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) 09/16/2021   History of MI (myocardial infarction) 09/16/2021   Epigastric pain 09/16/2021   Mid back pain 09/16/2021   Cervical radiculopathy 11/14/2020   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   Left knee pain 07/13/2018   Calcaneal spur of foot, right 10/30/2017   Neck pain 10/30/2017   Screening for AAA (abdominal aortic aneurysm) 10/30/2017   AAA (abdominal aortic aneurysm) 10/28/2017   Abnormal EKG 10/28/2017   Chest pain 10/28/2017   Primary osteoarthritis of left knee 10/28/2017   Left insertional Achilles tendinitis 10/28/2017   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   Tailor's bunion of both feet 09/20/2017   Ingrown nail 09/18/2017   Tenosynovitis of right foot 09/18/2017   Chronic heel pain, right 09/18/2017   Dyslipidemia 05/28/2017   Status post coronary artery stent placement 05/28/2017   Abnormal chest CT 05/28/2017   Coronary artery disease involving coronary  bypass graft of native heart with angina pectoris (HCC) 05/20/2017   Insomnia 07/16/2015   Essential hypertension 07/06/2015   Dyspnea on effort 07/06/2015    Isom Kochan 07/08/2015, PT, MPH  11/05/2021, 9:31 AM  [facility] [address] [place] [address] [place], [place], [place] Phone: [phone]   Fax:  [phone]  Name: DIONISIO ARAGONES MRN: Emilia Beck Date of Birth: July 22, 1951

## 2021-11-07 ENCOUNTER — Encounter: Payer: Self-pay | Admitting: Rehabilitative and Restorative Service Providers"

## 2021-11-07 ENCOUNTER — Ambulatory Visit: Payer: Medicare Other | Attending: Sports Medicine | Admitting: Rehabilitative and Restorative Service Providers"

## 2021-11-07 ENCOUNTER — Other Ambulatory Visit: Payer: Self-pay

## 2021-11-07 DIAGNOSIS — R29898 Other symptoms and signs involving the musculoskeletal system: Secondary | ICD-10-CM | POA: Diagnosis present

## 2021-11-07 DIAGNOSIS — M5412 Radiculopathy, cervical region: Secondary | ICD-10-CM | POA: Insufficient documentation

## 2021-11-07 DIAGNOSIS — R293 Abnormal posture: Secondary | ICD-10-CM | POA: Diagnosis present

## 2021-11-07 NOTE — Patient Instructions (Signed)
Access Code: [email or web address] ?URL: [email or web address] ?Date: 11/07/2021 ?Prepared by: Corlis Leak ? ?Exercises ?Seated Cervical Retraction - 3 x daily - 7 x weekly - 1 sets - 10 reps ?Standing Scapular Retraction - 3 x daily - 7 x weekly - 1 sets - 10 reps - 10 hold ?Shoulder External Rotation and Scapular Retraction - 3 x daily - 7 x weekly - 1 sets - 10 reps - hold ?Shoulder External Rotation in 45 Degrees Abduction - 2 x daily - 7 x weekly - 1-2 sets - 10 reps - 3 sec hold ?Doorway Pec Stretch at 60 Degrees Abduction - 3 x daily - 7 x weekly - 1 sets - 3 reps ?Doorway Pec Stretch at 90 Degrees Abduction - 3 x daily - 7 x weekly - 1 sets - 3 reps - 30 seconds hold ?Doorway Pec Stretch at 120 Degrees Abduction - 3 x daily - 7 x weekly - 1 sets - 3 reps - 30 second hold hold ?Shoulder External Rotation and Scapular Retraction with Resistance - 2 x daily - 7 x weekly - 3 sets - 10 reps ?Scapular Retraction with Resistance - 2 x daily - 7 x weekly - 3 sets - 10 reps ?Standing Shoulder Row Reactive Isometric - 2 x daily - 7 x weekly - 1 sets - 10 reps - 3-5 sec hold ?Anti-Rotation Lateral Stepping with Press - 2 x daily - 7 x weekly - 1-2 sets - 10 reps - 2-3 sec hold ?Standing Shoulder Flexion Wall Walk - 2 x daily - 7 x weekly - 1 sets - 3 reps - 30 sec hold ?Seated Cervical Sidebending AROM - 2 x daily - 7 x weekly - 1 sets - 5 reps - 5-10 sec hold ?Seated Upper Trapezius Stretch - 2 x daily - 7 x weekly - 1 sets - 3 reps - 5-10 sec hold ?Upper Trapezius Stretch - 2 x daily - 7 x weekly - 1 sets - 3 reps - 5-10 sec hold ?Shoulder External Rotation in Abduction with Anchored Resistance - 2 x daily - 7 x weekly - 1 sets - 10 reps - 3 sec hold ? ?

## 2021-11-07 NOTE — Therapy (Signed)
[facility] [place], [place], [address] Phone: [phone]   Fax:  [phone]  Physical Therapy Treatment  Patient Details  Name: Jerome Chase MRN: [id number] Date of Birth: 1951/05/13 Referring Provider (PT): Dr Dianah Field   Encounter Date: 11/07/2021   PT End of Session - 11/07/21 0850     Visit Number 9    Number of Visits 12    Date for PT Re-Evaluation 11/13/21    PT Start Time 0848    PT Stop Time 0936    PT Time Calculation (min) 48 min    Activity Tolerance Patient tolerated treatment well             Past Medical History:  Diagnosis Date   AAA (abdominal aortic aneurysm) 10/28/2017   Abnormal EKG 10/28/2017   Coronary artery disease involving coronary bypass graft of native heart with angina pectoris ([place]) 05/20/2017   Dyslipidemia 05/28/2017   Essential hypertension 07/06/2015   Heart attack ([place])    Hypertension    Status post coronary artery stent placement 05/28/2017    Past Surgical History:  Procedure Laterality Date   CARDIAC SURGERY      There were no vitals filed for this visit.   Subjective Assessment - 11/07/21 0850     Subjective Making some progress gradually. Still stiff in the mornings but seems looser than it was.    Currently in Pain? No/denies    Pain Score 0-No pain    Pain Orientation Left;Right    Pain Descriptors / Indicators Tightness   stiffness                              [facility] Adult PT Treatment/Exercise - 11/07/21 0001       Shoulder Exercises: Seated   Other Seated Exercises thoracic extension sitting with coregeous ball at thoracic spine    Other Seated Exercises axial extension 10 sec x 5 reps sitting and standing; upper trap stretch sitting 10 sec x 3 reps      Shoulder Exercises: Standing   External Rotation AROM;Strengthening;Both;15 reps    External Rotation Limitations coregeous ball at thoracic spine back to wall  working on bilat external rotation with horizontal abduction to improve thoracic extension    Row Strengthening;Both;10 reps;Theraband    Theraband Level (Shoulder Row) Level 4 (Blue)    Row Limitations repeated x 5 with 20 sec isometric hold blue TB    Retraction Strengthening;Both;10 reps;Theraband    Theraband Level (Shoulder Retraction) Level 2 (Red)    Other Standing Exercises antirotation blue TB x 10 reps each side 3 sec hold      Shoulder Exercises: Stretch   Other Shoulder Stretches doorway stretch x 3 positions 20 sec 3 reps each position    Other Shoulder Stretches shoulder flexion hands on top of door frame stepping through the doorway to stretch into shoulder flexion - 30 sec x 2 reps      Moist Heat Therapy   Number Minutes Moist Heat 10 Minutes    Moist Heat Location Cervical;Shoulder      Electrical Stimulation   Electrical Stimulation Location Lt cervical and upper trap    Electrical Stimulation Action TENs    Electrical Stimulation Parameters to tolerance    Electrical Stimulation Goals Pain;Tone      Manual Therapy   Manual therapy comments skilled palpation to assess response to Dn and manual work  Joint Mobilization cervical lateral mobs Grade II/III    Soft tissue mobilization deep tissue work through ant/lat/post cervical musculature; upper traps    Myofascial Release anterior chest              Trigger Point Dry Needling - 11/07/21 0001     Consent Given? Yes    Education Handout Provided Previously provided    Dry Needling Comments Lt/ Rt thoracic paraspinals    Electrical Stimulation Performed with Dry Needling Yes    Other Dry Needling mAmp x 5 min    Upper Trapezius Response Palpable increased muscle length;Twitch reponse elicited    Suboccipitals Response Palpable increased muscle length;Twitch response elicited    Scalenes Response Palpable increased muscle length;Twitch reponse elicited    Cervical multifidi Response Palpable increased  muscle length;Twitch reponse elicited    Thoracic multifidi response Palpable increased muscle length;Twitch response elicited                   PT Education - 11/07/21 0907     Education Details HEP    Person(s) Educated Patient    Methods Explanation;Demonstration;Tactile cues;Verbal cues;Handout    Comprehension Verbalized understanding;Returned demonstration;Verbal cues required;Tactile cues required                 PT Long Term Goals - 10/02/21 1056       PT LONG TERM GOAL #1   Title Improve posture and alignment with patient to demonstrate improved upright posture with posterior shoulder girdle engaged    Time 6    Period Weeks    Status New    Target Date 11/13/21      PT LONG TERM GOAL #2   Title Decrease pain in the cervial spine and Lt UE allowing patient to perform functional activities with minimal pain and discomfort    Time 6    Period Weeks    Status New    Target Date 11/13/21      PT LONG TERM GOAL #3   Title Increase cervical ROM in lateral flexion and rotation by 5-7 degrees with no pain    Time 6    Period Weeks    Status New    Target Date 11/13/21      PT LONG TERM GOAL #4   Title Imdependent in HEP    Time 6    Period Weeks    Status New    Target Date 11/13/21      PT LONG TERM GOAL #5   Title Improve functional limitation score to 57    Time 6    Period Weeks    Status New    Target Date 11/13/21                   Plan - 11/07/21 0851     Clinical Impression Statement Gradual porgress with cervical symptoms. No radicular symptoms. Persistent muscular tightness in the cervical and thoracic spine.    Rehab Potential Good    PT Frequency 2x / week    PT Duration 6 weeks    PT Treatment/Interventions ADLs/Self Care Home Management;Cryotherapy;Electrical Stimulation;Iontophoresis 4mg /ml Dexamethasone;Moist Heat;Ultrasound;Therapeutic activities;Therapeutic exercise;Balance training;Neuromuscular  re-education;Patient/family education;Manual techniques;Dry needling;Taping    PT Next Visit Plan review HEP; progress with pec stretch and posterior shoulder girdle strengthening; thoracic mobs; DN/manual work; postural correction; modalities as indicated - add trial of shoulder horizontal abduction at wall    PT Home Exercise Plan [email or web address]    Consulted and Agree with Plan  of Care Patient             Patient will benefit from skilled therapeutic intervention in order to improve the following deficits and impairments:     Visit Diagnosis: Radiculopathy, cervical region  Other symptoms and signs involving the musculoskeletal system  Abnormal posture     Problem List Patient Active Problem List   Diagnosis Date Noted   Erectile dysfunction 09/30/2021   SOB (shortness of breath) 09/16/2021   Numbness and tingling in left arm 09/16/2021   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 ([place]) 09/16/2021   History of MI (myocardial infarction) 09/16/2021   Epigastric pain 09/16/2021   Mid back pain 09/16/2021   Cervical radiculopathy 11/14/2020   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   Left knee pain 07/13/2018   Calcaneal spur of foot, right 10/30/2017   Neck pain 10/30/2017   Screening for AAA (abdominal aortic aneurysm) 10/30/2017   AAA (abdominal aortic aneurysm) 10/28/2017   Abnormal EKG 10/28/2017   Chest pain 10/28/2017   Primary osteoarthritis of left knee 10/28/2017   Left insertional Achilles tendinitis 10/28/2017   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   Tailor's bunion of both feet 09/20/2017   Ingrown nail 09/18/2017   Tenosynovitis of right foot 09/18/2017   Chronic heel pain, right 09/18/2017   Dyslipidemia 05/28/2017   Status post coronary artery stent placement 05/28/2017   Abnormal chest CT 05/28/2017   Coronary artery disease involving coronary bypass graft of native heart with angina pectoris ([place]) 05/20/2017   Insomnia 07/16/2015    Essential hypertension 07/06/2015   Dyspnea on effort 07/06/2015    Cordarryl Monrreal Nilda Simmer, PT, MPH 11/07/2021, 9:26 AM  [facility] [address] [place], [place], [address] Phone: [phone]   Fax:  [phone]  Name: Jerome Chase MRN: [id number] Date of Birth: May 05, 1951

## 2021-11-08 DIAGNOSIS — I25709 Atherosclerosis of coronary artery bypass graft(s), unspecified, with unspecified angina pectoris: Secondary | ICD-10-CM | POA: Diagnosis not present

## 2021-11-12 ENCOUNTER — Ambulatory Visit: Payer: Medicare Other | Admitting: Rehabilitative and Restorative Service Providers"

## 2021-11-12 ENCOUNTER — Other Ambulatory Visit: Payer: Self-pay

## 2021-11-12 ENCOUNTER — Encounter: Payer: Self-pay | Admitting: Rehabilitative and Restorative Service Providers"

## 2021-11-12 DIAGNOSIS — R293 Abnormal posture: Secondary | ICD-10-CM | POA: Diagnosis not present

## 2021-11-12 DIAGNOSIS — M5412 Radiculopathy, cervical region: Secondary | ICD-10-CM

## 2021-11-12 DIAGNOSIS — R29898 Other symptoms and signs involving the musculoskeletal system: Secondary | ICD-10-CM

## 2021-11-12 NOTE — Patient Instructions (Signed)
Access Code: [email or web address] ?URL: [email or web address] ?Date: 11/12/2021 ?Prepared by: Corlis Leak ? ?Exercises ?Seated Cervical Retraction - 3 x daily - 7 x weekly - 1 sets - 10 reps ?Standing Scapular Retraction - 3 x daily - 7 x weekly - 1 sets - 10 reps - 10 hold ?Shoulder External Rotation and Scapular Retraction - 3 x daily - 7 x weekly - 1 sets - 10 reps - hold ?Shoulder External Rotation in 45 Degrees Abduction - 2 x daily - 7 x weekly - 1-2 sets - 10 reps - 3 sec hold ?Doorway Pec Stretch at 60 Degrees Abduction - 3 x daily - 7 x weekly - 1 sets - 3 reps ?Doorway Pec Stretch at 90 Degrees Abduction - 3 x daily - 7 x weekly - 1 sets - 3 reps - 30 seconds hold ?Doorway Pec Stretch at 120 Degrees Abduction - 3 x daily - 7 x weekly - 1 sets - 3 reps - 30 second hold hold ?Shoulder External Rotation and Scapular Retraction with Resistance - 2 x daily - 7 x weekly - 3 sets - 10 reps ?Scapular Retraction with Resistance - 2 x daily - 7 x weekly - 3 sets - 10 reps ?Standing Shoulder Row Reactive Isometric - 2 x daily - 7 x weekly - 1 sets - 10 reps - 3-5 sec hold ?Anti-Rotation Lateral Stepping with Press - 2 x daily - 7 x weekly - 1-2 sets - 10 reps - 2-3 sec hold ?Standing Shoulder Flexion Wall Walk - 2 x daily - 7 x weekly - 1 sets - 3 reps - 30 sec hold ?Seated Cervical Sidebending AROM - 2 x daily - 7 x weekly - 1 sets - 5 reps - 5-10 sec hold ?Seated Upper Trapezius Stretch - 2 x daily - 7 x weekly - 1 sets - 3 reps - 5-10 sec hold ?Upper Trapezius Stretch - 2 x daily - 7 x weekly - 1 sets - 3 reps - 5-10 sec hold ?Shoulder External Rotation in Abduction with Anchored Resistance - 2 x daily - 7 x weekly - 1 sets - 10 reps - 3 sec hold ?Standing Shoulder External Rotation Stretch at Wall - 2 x daily - 7 x weekly - 1 sets - 3 reps - 30 sec hold ? ?

## 2021-11-12 NOTE — Therapy (Signed)
[facility] [id number] [place], [place], [address] Phone: [phone]   Fax:  [phone]  Physical Therapy Treatment; medicare [id number] visit and Re-certification  Progress Note Reporting Period 10/02/21 to 11/12/21  See note below for Objective Data and Assessment of Progress/Goals.      Patient Details  Name: Jerome Chase MRN: [id number] Date of Birth: December 08, 1950 Referring Provider (PT): Dr Dianah Field   Encounter Date: 11/12/2021   PT End of Session - 11/12/21 0850     Visit Number 10    Number of Visits 22    Date for PT Re-Evaluation 12/24/21    PT Start Time 0845    PT Stop Time [id number]    PT Time Calculation (min) 49 min    Activity Tolerance Patient tolerated treatment well             Past Medical History:  Diagnosis Date   AAA (abdominal aortic aneurysm) 10/28/2017   Abnormal EKG 10/28/2017   Coronary artery disease involving coronary bypass graft of native heart with angina pectoris ([place]) 05/20/2017   Dyslipidemia 05/28/2017   Essential hypertension 07/06/2015   Heart attack ([place])    Hypertension    Status post coronary artery stent placement 05/28/2017    Past Surgical History:  Procedure Laterality Date   CARDIAC SURGERY      There were no vitals filed for this visit.   Subjective Assessment - 11/12/21 0851     Subjective Making progress. neck is not as tight and no symptoms in his arm. has a headache this morning. Working on his exercises at home and trying to correct posture in his work day.    Currently in Pain? Yes    Pain Score 2     Pain Location Neck    Pain Orientation Left;Right    Pain Descriptors / Indicators Tightness    Pain Type Acute pain;Chronic pain    Pain Onset More than a month ago    Pain Frequency Intermittent                [facility] PT Assessment - 11/12/21 0001       Assessment   Medical Diagnosis Cervical radiculopathy    Referring Provider (PT) Dr  Dianah Field    Onset Date/Surgical Date 08/17/21   neck pain for years   Hand Dominance Right    Next MD Visit 11/13/21    Prior Therapy here of knee 2019      Observation/Other Assessments   Focus on Therapeutic Outcomes (FOTO)  53      AROM   Cervical Flexion 64    Cervical Extension 37 tightness    Cervical - Right Side Bend 27    Cervical - Left Side Bend 22 tight, pain    Cervical - Right Rotation 48 tight    Cervical - Left Rotation 47 tight      Strength   Overall Strength Comments iproving strength in posterior shoulder girdle musculature - postural musculature      Palpation   Spinal mobility hypomobile thoracic and cervical spine with PA mobs    Palpation comment muscular tightness Lt > Rt ant/lat/post cervical musculature; pecs; upper trap; cervical and thoracic paraspinals                           [facility] Adult PT Treatment/Exercise - 11/12/21 0001       Neuro Re-ed    Neuro Re-ed Details  working on posture and alignment - encouraging posterior shoulder girdle activation      Shoulder Exercises: Seated   Other Seated Exercises thoracic extension sitting with coregeous ball at thoracic spine    Other Seated Exercises axial extension 10 sec x 5 reps sitting and standing; upper trap stretch sitting 10 sec x 3 reps      Shoulder Exercises: Standing   External Rotation AROM;Strengthening;Both;15 reps    External Rotation Limitations coregeous ball at thoracic spine back to wall working on bilat external rotation with horizontal abduction to improve thoracic extension    Row Strengthening;Both;10 reps;Theraband    Theraband Level (Shoulder Row) Level 4 (Blue)    Row Limitations repeated x 5 with 20 sec isometric hold blue TB    Retraction Strengthening;Both;10 reps;Theraband    Theraband Level (Shoulder Retraction) Level 2 (Red)    Other Standing Exercises antirotation blue TB x 10 reps each side 3 sec hold      Shoulder Exercises: Stretch   Wall  Stretch - ABduction Limitations horizontal labduction stretch at wall shoulder ~ 80 deg abduction elbow straight 30 sec old x 1 rep    Other Shoulder Stretches doorway stretch x 3 positions 20 sec 3 reps each position    Other Shoulder Stretches shoulder flexion hands on top of door frame stepping through the doorway to stretch into shoulder flexion - 30 sec x 2 reps      Moist Heat Therapy   Number Minutes Moist Heat 10 Minutes    Moist Heat Location Cervical;Shoulder      Electrical Stimulation   Electrical Stimulation Location Lt cervical and upper trap    Electrical Stimulation Action TENs    Electrical Stimulation Parameters to tolerance    Electrical Stimulation Goals Pain;Tone      Manual Therapy   Manual therapy comments skilled palpation to assess response to Dn and manual work    Joint Mobilization cervical lateral mobs Grade II/III    Soft tissue mobilization deep tissue work through ant/lat/post cervical musculature; upper traps              Trigger Point Dry Needling - 11/12/21 0001     Consent Given? Yes    Education Handout Provided Previously provided    Dry Needling Comments Lt/ Rt thoracic paraspinals    Upper Trapezius Response Palpable increased muscle length;Twitch reponse elicited    Suboccipitals Response Palpable increased muscle length;Twitch response elicited    Semispinalis capitus Response Palpable increased muscle length;Twitch reponse elicited    Cervical multifidi Response Palpable increased muscle length;Twitch reponse elicited                   PT Education - 11/12/21 0904     Education Details HEP    Person(s) Educated Patient    Methods Explanation;Demonstration;Tactile cues;Verbal cues;Handout    Comprehension Verbalized understanding;Returned demonstration;Verbal cues required;Tactile cues required                 PT Long Term Goals - 11/12/21 0909       PT LONG TERM GOAL #1   Title Improve posture and alignment  with patient to demonstrate improved upright posture with posterior shoulder girdle engaged    Time 6    Status On-going    Target Date 12/24/21      PT LONG TERM GOAL #2   Title Decrease pain in the cervial spine and Lt UE allowing patient to perform functional activities with minimal pain and discomfort  Time 6    Period Weeks    Status On-going    Target Date 12/24/21      PT LONG TERM GOAL #3   Title Increase cervical ROM in lateral flexion and rotation by 5-7 degrees with no pain    Time 6    Period Weeks    Status On-going    Target Date 12/24/21      PT LONG TERM GOAL #4   Title Imdependent in HEP    Time 6    Period Weeks    Status On-going    Target Date 12/24/21      PT LONG TERM GOAL #5   Title Improve functional limitation score to 57    Time 6    Period Weeks    Status On-going    Target Date 12/24/21                   Plan - 11/12/21 0852     Clinical Impression Statement Patient reports good progress since beginning therapy. He has no Lt UE symptoms and less tightness in the neck. He has a headache this moring and is not sure if that is from the neck or the way he slept. Patient demonstrates increasing cervical mobility. He has continued muscular tightness in the cervical area Lt > Rt. Good response to DN and manual work. Will benefit from continued treatment to achieve maximum rehab potential and reach goals of therapy.    Rehab Potential Good    PT Frequency 2x / week    PT Duration 6 weeks    PT Treatment/Interventions ADLs/Self Care Home Management;Cryotherapy;Electrical Stimulation;Iontophoresis 4mg /ml Dexamethasone;Moist Heat;Ultrasound;Therapeutic activities;Therapeutic exercise;Balance training;Neuromuscular re-education;Patient/family education;Manual techniques;Dry needling;Taping    PT Next Visit Plan review HEP; progress with pec stretch and posterior shoulder girdle strengthening; thoracic mobs; DN/manual work; postural correction;  modalities as indicated - add trial of shoulder horizontal abduction at wall    PT Home Exercise Plan [email or web address]    Consulted and Agree with Plan of Care Patient             Patient will benefit from skilled therapeutic intervention in order to improve the following deficits and impairments:     Visit Diagnosis: Radiculopathy, cervical region  Other symptoms and signs involving the musculoskeletal system  Abnormal posture     Problem List Patient Active Problem List   Diagnosis Date Noted   Erectile dysfunction 09/30/2021   SOB (shortness of breath) 09/16/2021   Numbness and tingling in left arm 09/16/2021   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 ([place]) 09/16/2021   History of MI (myocardial infarction) 09/16/2021   Epigastric pain 09/16/2021   Mid back pain 09/16/2021   Cervical radiculopathy 11/14/2020   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   Left knee pain 07/13/2018   Calcaneal spur of foot, right 10/30/2017   Neck pain 10/30/2017   Screening for AAA (abdominal aortic aneurysm) 10/30/2017   AAA (abdominal aortic aneurysm) 10/28/2017   Abnormal EKG 10/28/2017   Chest pain 10/28/2017   Primary osteoarthritis of left knee 10/28/2017   Left insertional Achilles tendinitis 10/28/2017   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   Tailor's bunion of both feet 09/20/2017   Ingrown nail 09/18/2017   Tenosynovitis of right foot 09/18/2017   Chronic heel pain, right 09/18/2017   Dyslipidemia 05/28/2017   Status post coronary artery stent placement 05/28/2017   Abnormal chest CT 05/28/2017   Coronary artery disease involving coronary bypass graft of native heart with  angina pectoris ([place]) 05/20/2017   Insomnia 07/16/2015   Essential hypertension 07/06/2015   Dyspnea on effort 07/06/2015    Lyah Millirons Nilda Simmer, PT, MPH  11/12/2021, 9:34 AM  [facility] [place], [place],  [address] Phone: [phone]   Fax:  [phone]  Name: LEXANDER ZIRK MRN: [id number] Date of Birth: [birth year]

## 2021-11-13 ENCOUNTER — Ambulatory Visit (INDEPENDENT_AMBULATORY_CARE_PROVIDER_SITE_OTHER): Payer: Medicare Other | Admitting: Sports Medicine

## 2021-11-13 DIAGNOSIS — I25709 Atherosclerosis of coronary artery bypass graft(s), unspecified, with unspecified angina pectoris: Secondary | ICD-10-CM | POA: Diagnosis not present

## 2021-11-13 DIAGNOSIS — N529 Male erectile dysfunction, unspecified: Secondary | ICD-10-CM

## 2021-11-13 DIAGNOSIS — M5412 Radiculopathy, cervical region: Secondary | ICD-10-CM | POA: Diagnosis not present

## 2021-11-13 MED ORDER — TADALAFIL 20 MG PO TABS: 20.0000 mg | ORAL_TABLET | Freq: Every day | ORAL | 11 refills | Status: DC | PRN

## 2021-11-13 NOTE — Assessment & Plan Note (Signed)
Good improvements in radicular symptoms with physical therapy, steroids and gabapentin, only doing gabapentin at night. ?He will work on increasing his gabapentin up to 3 times daily as needed, he feels as though he has not yet plateaued so we can revisit this again in 6 weeks. ?If insufficient improvement we will add an MRI for epidural planning. ?

## 2021-11-13 NOTE — Progress Notes (Signed)
? ? ?  Procedures performed today:   ? ?None. ? ?Independent interpretation of notes and tests performed by another provider:  ? ?None. ? ?Brief History, Exam, Impression, and Recommendations:   ? ?Erectile dysfunction ?This is a very pleasant 71 year old male, we have been helping him with erectile dysfunction as his PCP has left the office, he does have the desire, but he is unable to maintain solid erections enough for penetration. ?We added Cialis 5 daily, this has helped to some degree but not sufficiently. ?I explained the importance of compression of venous outflow as the penis becomes erect, we will bump up his Cialis to 20 mg before weekends when he sees his girlfriend, and he will also get a penile compressive ring to aid in reducing venous outflow. ? ? ?Cervical radiculopathy ?Good improvements in radicular symptoms with physical therapy, steroids and gabapentin, only doing gabapentin at night. ?He will work on increasing his gabapentin up to 3 times daily as needed, he feels as though he has not yet plateaued so we can revisit this again in 6 weeks. ?If insufficient improvement we will add an MRI for epidural planning. ? ?Chronic process not at goal with pharmacologic intervention ? ?___________________________________________ ?Ihor Austin. [name], M.D., ABFM., CAQSM. ?Primary Care and Sports Medicine ?[facility] ? ?Adjunct Instructor of Family Medicine  ?[employer] ?

## 2021-11-13 NOTE — Assessment & Plan Note (Signed)
This is a very pleasant 71 year old male, we have been helping him with erectile dysfunction as his PCP has left the office, he does have the desire, but he is unable to maintain solid erections enough for penetration. ?We added Cialis 5 daily, this has helped to some degree but not sufficiently. ?I explained the importance of compression of venous outflow as the penis becomes erect, we will bump up his Cialis to 20 mg before weekends when he sees his girlfriend, and he will also get a penile compressive ring to aid in reducing venous outflow. ? ?

## 2021-11-14 ENCOUNTER — Ambulatory Visit: Payer: Medicare Other | Admitting: Rehabilitative and Restorative Service Providers"

## 2021-12-18 DIAGNOSIS — N400 Enlarged prostate without lower urinary tract symptoms: Secondary | ICD-10-CM | POA: Diagnosis not present

## 2021-12-18 DIAGNOSIS — N5201 Erectile dysfunction due to arterial insufficiency: Secondary | ICD-10-CM | POA: Diagnosis not present

## 2021-12-25 ENCOUNTER — Ambulatory Visit: Payer: Medicare Other | Admitting: Sports Medicine

## 2021-12-25 DIAGNOSIS — N5201 Erectile dysfunction due to arterial insufficiency: Secondary | ICD-10-CM | POA: Diagnosis not present

## 2022-01-01 ENCOUNTER — Ambulatory Visit (INDEPENDENT_AMBULATORY_CARE_PROVIDER_SITE_OTHER): Payer: Medicare Other | Admitting: Sports Medicine

## 2022-01-01 DIAGNOSIS — M5412 Radiculopathy, cervical region: Secondary | ICD-10-CM | POA: Diagnosis not present

## 2022-01-01 DIAGNOSIS — N529 Male erectile dysfunction, unspecified: Secondary | ICD-10-CM | POA: Diagnosis not present

## 2022-01-01 DIAGNOSIS — I25709 Atherosclerosis of coronary artery bypass graft(s), unspecified, with unspecified angina pectoris: Secondary | ICD-10-CM | POA: Diagnosis not present

## 2022-01-01 DIAGNOSIS — M542 Cervicalgia: Secondary | ICD-10-CM | POA: Diagnosis not present

## 2022-01-01 NOTE — Assessment & Plan Note (Signed)
Jerome Chase returns, he is a very pleasant 71 year old male, he has chronic neck pain, he did have some improvements in radicular symptoms with formal physical therapy, steroids, gabapentin, increase the gabapentin to 3 times daily, unfortunately still having discomfort right side of the neck worse with turning the head to the left or right, at this point we will proceed with MRI for epidural planning, likely right C6-C7 interlaminar with a 4-week follow-up. ?I will order the injection as soon as I see his MRI. ?

## 2022-01-01 NOTE — Progress Notes (Signed)
? ? ?  Procedures performed today:   ? ?None. ? ?Independent interpretation of notes and tests performed by another provider:  ? ?None. ? ?Brief History, Exam, Impression, and Recommendations:   ? ?Cervical radiculopathy ?Jerome Chase returns, he is a very pleasant 71 year old male, he has chronic neck pain, he did have some improvements in radicular symptoms with formal physical therapy, steroids, gabapentin, increase the gabapentin to 3 times daily, unfortunately still having discomfort right side of the neck worse with turning the head to the left or right, at this point we will proceed with MRI for epidural planning, likely right C6-C7 interlaminar with a 4-week follow-up. ?I will order the injection as soon as I see his MRI. ? ?Erectile dysfunction ?Cialis 20 mg daily, penile pump, and compressive ring not effective, he did start TriMix injections which seems to work. ? ? ? ?___________________________________________ ?Ihor Austin. [name], M.D., ABFM., CAQSM. ?Primary Care and Sports Medicine ?[facility] ? ?Adjunct Instructor of Family Medicine  ?[employer] ?

## 2022-01-01 NOTE — Assessment & Plan Note (Signed)
Cialis 20 mg daily, penile pump, and compressive ring not effective, he did start TriMix injections which seems to work. ?

## 2022-01-22 DIAGNOSIS — N5201 Erectile dysfunction due to arterial insufficiency: Secondary | ICD-10-CM | POA: Diagnosis not present

## 2022-03-25 DIAGNOSIS — I25709 Atherosclerosis of coronary artery bypass graft(s), unspecified, with unspecified angina pectoris: Secondary | ICD-10-CM | POA: Diagnosis not present

## 2022-03-25 DIAGNOSIS — I10 Essential (primary) hypertension: Secondary | ICD-10-CM | POA: Diagnosis not present

## 2022-03-25 DIAGNOSIS — I2510 Atherosclerotic heart disease of native coronary artery without angina pectoris: Secondary | ICD-10-CM | POA: Diagnosis not present

## 2022-04-23 ENCOUNTER — Other Ambulatory Visit: Payer: Self-pay | Admitting: Sports Medicine

## 2022-04-23 DIAGNOSIS — M5412 Radiculopathy, cervical region: Secondary | ICD-10-CM

## 2022-04-29 DIAGNOSIS — N5201 Erectile dysfunction due to arterial insufficiency: Secondary | ICD-10-CM | POA: Diagnosis not present

## 2022-05-07 ENCOUNTER — Encounter: Payer: Self-pay | Admitting: Physician Assistant

## 2022-05-07 ENCOUNTER — Ambulatory Visit (INDEPENDENT_AMBULATORY_CARE_PROVIDER_SITE_OTHER): Payer: Medicare Other | Admitting: Physician Assistant

## 2022-05-07 ENCOUNTER — Ambulatory Visit (INDEPENDENT_AMBULATORY_CARE_PROVIDER_SITE_OTHER): Payer: Medicare Other

## 2022-05-07 VITALS — BP 149/90 | HR 48 | Ht 73.0 in | Wt 212.0 lb

## 2022-05-07 DIAGNOSIS — R110 Nausea: Secondary | ICD-10-CM

## 2022-05-07 DIAGNOSIS — M549 Dorsalgia, unspecified: Secondary | ICD-10-CM | POA: Diagnosis not present

## 2022-05-07 DIAGNOSIS — R142 Eructation: Secondary | ICD-10-CM

## 2022-05-07 DIAGNOSIS — I252 Old myocardial infarction: Secondary | ICD-10-CM

## 2022-05-07 DIAGNOSIS — R1013 Epigastric pain: Secondary | ICD-10-CM

## 2022-05-07 DIAGNOSIS — R001 Bradycardia, unspecified: Secondary | ICD-10-CM | POA: Diagnosis not present

## 2022-05-07 DIAGNOSIS — I25709 Atherosclerosis of coronary artery bypass graft(s), unspecified, with unspecified angina pectoris: Secondary | ICD-10-CM | POA: Diagnosis not present

## 2022-05-07 DIAGNOSIS — K760 Fatty (change of) liver, not elsewhere classified: Secondary | ICD-10-CM | POA: Diagnosis not present

## 2022-05-07 LAB — TROPONIN I: Troponin I: 6 ng/L (ref <=47)

## 2022-05-07 LAB — CK TOTAL AND CKMB (NOT AT ARMC)
CK, MB: 1.9 ng/mL (ref 0–5.0)
Relative Index: 1 (ref 0–4.0)
Total CK: 186 U/L (ref 44–196)

## 2022-05-07 MED ORDER — HYOSCYAMINE SULFATE 0.125 MG PO TBDP
0.1250 mg | ORAL_TABLET | Freq: Once | ORAL | Status: AC
  Administered 2022-05-07: 0.125 mg via SUBLINGUAL

## 2022-05-07 MED ORDER — LIDOCAINE VISCOUS HCL 2 % MT SOLN
15.0000 mL | Freq: Once | OROMUCOSAL | Status: AC
  Administered 2022-05-07: 15 mL via OROMUCOSAL

## 2022-05-07 MED ORDER — ALUM & MAG HYDROXIDE-SIMETH 200-200-20 MG/5ML PO SUSP
30.0000 mL | Freq: Once | ORAL | Status: AC
  Administered 2022-05-07: 30 mL via ORAL

## 2022-05-07 MED ORDER — OMEPRAZOLE 40 MG PO CPDR: 40.0000 mg | DELAYED_RELEASE_CAPSULE | Freq: Every day | ORAL | 1 refills | Status: DC

## 2022-05-07 NOTE — Progress Notes (Signed)
No acute findings.  You do have fatty liver. I would cut back and/or avoid alcohol all together to help with this.   You do have plaque in abdominal aortic. Make sure to stay on crestor.   Did you feel better after the GI cocktail in office?

## 2022-05-07 NOTE — Progress Notes (Signed)
Acute Office Visit  Subjective:     Patient ID: Jerome Chase, male    DOB: 1951-04-08, 71 y.o.   MRN: [id number]  Chief Complaint  Patient presents with   Abdominal Pain    Epigastric pain, through right shoulder blade, nausea, hx heart surgery    HPI Patient is in today for right upper quadrant and epigastric pain for the last 2 weeks. He noticed his HR has been lower than normal as well. He called cardiology on 04/30/2022 and was told to decreased metoprolol to 25mg  daily. He has done this with little benefit in HR increase. He does feel like epigastric symptoms get worse when he eats and lies down. Has some nausea but not vomiting. He is belching a lot and that does make him feel better. Not on acid reducers. Not worse with exertion. His pain can radiate into back and right shoulder. Denies any constipation or diarrhea as well as no melena or hematochezia. He denies any regular ibuprofen or NsAId use and drinks a couple of beers ever weekend. No fever, chills, body aches.   .. Active Ambulatory Problems    Diagnosis Date Noted   AAA (abdominal aortic aneurysm) (HCC) 10/28/2017   Abnormal EKG 10/28/2017   Chest pain 10/28/2017   Coronary artery disease involving coronary bypass graft of native heart with angina pectoris (HCC) 05/20/2017   Dyslipidemia 05/28/2017   Essential hypertension 07/06/2015   Ingrown nail 09/18/2017   Insomnia 07/16/2015   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   Dyspnea on effort 07/06/2015   Status post coronary artery stent placement 05/28/2017   Tailor's bunion of both feet 09/20/2017   Tenosynovitis of right foot 09/18/2017   Chronic heel pain, right 09/18/2017   Abnormal chest CT 05/28/2017   Primary osteoarthritis of left knee 10/28/2017   Left insertional Achilles tendinitis 10/28/2017   Calcaneal spur of foot, right 10/30/2017   Neck pain 10/30/2017   Screening for AAA (abdominal aortic aneurysm) 10/30/2017   Left knee pain  07/13/2018   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   Cervical radiculopathy 11/14/2020   SOB (shortness of breath) 09/16/2021   Numbness and tingling in left arm 09/16/2021   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) 09/16/2021   History of MI (myocardial infarction) 09/16/2021   Epigastric pain 09/16/2021   Mid back pain 09/16/2021   Erectile dysfunction 09/30/2021   Sinus bradycardia 05/07/2022   Pain radiating to back 05/07/2022   Belching 05/07/2022   Nausea 05/07/2022   Resolved Ambulatory Problems    Diagnosis Date Noted   No Resolved Ambulatory Problems   Past Medical History:  Diagnosis Date   Heart attack (HCC)    Hypertension      ROS  See HPI.     Objective:    BP (!) 149/90   Pulse (!) 48   Ht 6\' 1"  (1.854 m)   Wt 212 lb (96.2 kg)   SpO2 93%   BMI 27.97 kg/m  BP Readings from Last 3 Encounters:  05/07/22 (!) 149/90  09/16/21 (!) 143/98  04/25/21 (!) 162/83   Wt Readings from Last 3 Encounters:  05/07/22 212 lb (96.2 kg)  09/16/21 216 lb (98 kg)  04/25/21 234 lb 1.3 oz (106.2 kg)    EKG- Marked Sinus Bradycardia at 48 without ST depression or elevation.   Physical Exam Vitals reviewed.  Constitutional:      Appearance: He is well-developed.  HENT:     Head: Normocephalic.  Cardiovascular:  Rate and Rhythm: Regular rhythm. Bradycardia present.  Pulmonary:     Effort: Pulmonary effort is normal.  Abdominal:     General: Bowel sounds are normal. There is distension.     Tenderness: There is abdominal tenderness in the right upper quadrant and epigastric area. There is guarding. There is no right CVA tenderness, left CVA tenderness or rebound. Positive signs include Murphy's sign. Negative signs include Rovsing's sign, McBurney's sign, psoas sign and obturator sign.     Hernia: No hernia is present.  Neurological:     General: No focal deficit present.     Mental Status: He is alert.  Psychiatric:        Mood and  Affect: Mood normal.          Assessment & Plan:  Jerome Chase KitchenMarland KitchenVivek was seen today for abdominal pain.  Diagnoses and all orders for this visit:  Sinus bradycardia -     Troponin I - -     CK total and CKMB (cardiac)not at [facility]  Epigastric pain -     EKG 12-Lead -     CBC w/Diff/Platelet -     COMPLETE METABOLIC PANEL WITH GFR -     Lipase -     H. pylori breath test -     US Abdomen Complete; Future -     Troponin I - -     CK total and CKMB (cardiac)not at [facility] -     hyoscyamine (ANASPAZ) disintergrating tablet 0.125 mg -     lidocaine (XYLOCAINE) 2 % viscous mouth solution 15 mL -     alum & mag hydroxide-simeth (MAALOX/MYLANTA) 200-200-20 MG/5ML suspension 30 mL  History of MI (myocardial infarction) -     EKG 12-Lead -     Troponin I - -     CK total and CKMB (cardiac)not at [facility]  Nausea -     CBC w/Diff/Platelet -     COMPLETE METABOLIC PANEL WITH GFR -     Lipase -     H. pylori breath test -     US Abdomen Complete; Future -     Troponin I - -     CK total and CKMB (cardiac)not at [facility] -     hyoscyamine (ANASPAZ) disintergrating tablet 0.125 mg -     lidocaine (XYLOCAINE) 2 % viscous mouth solution 15 mL -     alum & mag hydroxide-simeth (MAALOX/MYLANTA) 200-200-20 MG/5ML suspension 30 mL  Belching -     CBC w/Diff/Platelet -     COMPLETE METABOLIC PANEL WITH GFR -     Lipase -     H. pylori breath test -     US Abdomen Complete; Future -     Troponin I - -     CK total and CKMB (cardiac)not at [facility] -     hyoscyamine (ANASPAZ) disintergrating tablet 0.125 mg -     lidocaine (XYLOCAINE) 2 % viscous mouth solution 15 mL -     alum & mag hydroxide-simeth (MAALOX/MYLANTA) 200-200-20 MG/5ML suspension 30 mL  Pain radiating to back -     CBC w/Diff/Platelet -     COMPLETE METABOLIC PANEL WITH GFR -     Lipase -     H. pylori breath test -     US Abdomen Complete; Future -     Troponin I - -     CK total and CKMB (cardiac)not at [facility] -     hyoscyamine  (ANASPAZ) disintergrating  tablet 0.125 mg -     lidocaine (XYLOCAINE) 2 % viscous mouth solution 15 mL -     alum & mag hydroxide-simeth (MAALOX/MYLANTA) 200-200-20 MG/5ML suspension 30 mL   Unclear etiology of symptoms cholecystitis/pancreatitis/peptic ulcer/MI EKG showed sinus bradycardia but no signs of ischemia Will fax to cardiologist and see if they would like to further decreased metoprolol to 12.5mg   Will get CBC/CMP/lipase/troponin/CKMB  Will get h.pylori testing as well GI cocktail given after testing Concern for cholecystitis due to a lot of RUQ pain Will get u/s of abdomen stat Depending on results may start PPI to see if we can help symptoms  Tandy Gaw, PA-C

## 2022-05-08 LAB — CBC WITH DIFFERENTIAL/PLATELET
Absolute Monocytes: 420 cells/uL (ref 200–950)
Basophils Absolute: 42 cells/uL (ref 0–200)
Basophils Relative: 0.7 %
Eosinophils Absolute: 282 cells/uL (ref 15–500)
Eosinophils Relative: 4.7 %
HCT: 44.8 % (ref 38.5–50.0)
Hemoglobin: 15.6 g/dL (ref 13.2–17.1)
Lymphs Abs: 1494 cells/uL (ref 850–3900)
MCH: 29.4 pg (ref 27.0–33.0)
MCHC: 34.8 g/dL (ref 32.0–36.0)
MCV: 84.4 fL (ref 80.0–100.0)
MPV: 10.5 fL (ref 7.5–12.5)
Monocytes Relative: 7 %
Neutro Abs: 3762 cells/uL (ref 1500–7800)
Neutrophils Relative %: 62.7 %
Platelets: 161 10*3/uL (ref 140–400)
RBC: 5.31 10*6/uL (ref 4.20–5.80)
RDW: 14.3 % (ref 11.0–15.0)
Total Lymphocyte: 24.9 %
WBC: 6 10*3/uL (ref 3.8–10.8)

## 2022-05-08 LAB — COMPLETE METABOLIC PANEL WITH GFR
AG Ratio: 1.9 (calc) (ref 1.0–2.5)
ALT: 17 U/L (ref 9–46)
AST: 19 U/L (ref 10–35)
Albumin: 5 g/dL (ref 3.6–5.1)
Alkaline phosphatase (APISO): 78 U/L (ref 35–144)
BUN: 16 mg/dL (ref 7–25)
CO2: 29 mmol/L (ref 20–32)
Calcium: 10.1 mg/dL (ref 8.6–10.3)
Chloride: 101 mmol/L (ref 98–110)
Creat: 1.07 mg/dL (ref 0.70–1.28)
Globulin: 2.6 g/dL (calc) (ref 1.9–3.7)
Glucose, Bld: 97 mg/dL (ref 65–99)
Potassium: 4.7 mmol/L (ref 3.5–5.3)
Sodium: 138 mmol/L (ref 135–146)
Total Bilirubin: 0.8 mg/dL (ref 0.2–1.2)
Total Protein: 7.6 g/dL (ref 6.1–8.1)
eGFR: 74 mL/min/{1.73_m2} (ref >=60)

## 2022-05-08 LAB — LIPASE: Lipase: 42 U/L (ref 7–60)

## 2022-05-08 LAB — H. PYLORI BREATH TEST: H. pylori Breath Test: DETECTED — AB

## 2022-05-09 ENCOUNTER — Other Ambulatory Visit: Payer: Self-pay | Admitting: Physician Assistant

## 2022-05-09 DIAGNOSIS — A048 Other specified bacterial intestinal infections: Secondary | ICD-10-CM

## 2022-05-09 MED ORDER — CLARITHROMYCIN 500 MG PO TABS: 500.0000 mg | ORAL_TABLET | Freq: Two times a day (BID) | ORAL | 0 refills | Status: DC

## 2022-05-09 MED ORDER — AMOXICILLIN 500 MG PO TABS: 1000.0000 mg | ORAL_TABLET | Freq: Two times a day (BID) | ORAL | 0 refills | Status: DC

## 2022-05-09 NOTE — Progress Notes (Signed)
Your breath test is positive for h.pylori which can be responsible for all your symptoms.   Treatment is: Omeprazole(which was already sent to pharmacy) 1 tablet twice a day for 14 days.  Clarithromycin 1 tablet twice a day for 14 days.  And  Amoxicillin 1 tablet twice a day for 14 days.   Recheck breath test in 1 month.

## 2022-07-01 ENCOUNTER — Ambulatory Visit (INDEPENDENT_AMBULATORY_CARE_PROVIDER_SITE_OTHER): Payer: Medicare Other | Admitting: Physician Assistant

## 2022-07-01 VITALS — BP 179/75 | HR 56 | Ht 73.0 in | Wt 214.0 lb

## 2022-07-01 DIAGNOSIS — I10 Essential (primary) hypertension: Secondary | ICD-10-CM | POA: Diagnosis not present

## 2022-07-01 DIAGNOSIS — M542 Cervicalgia: Secondary | ICD-10-CM | POA: Diagnosis not present

## 2022-07-01 DIAGNOSIS — I25709 Atherosclerosis of coronary artery bypass graft(s), unspecified, with unspecified angina pectoris: Secondary | ICD-10-CM

## 2022-07-01 DIAGNOSIS — R519 Headache, unspecified: Secondary | ICD-10-CM | POA: Insufficient documentation

## 2022-07-01 DIAGNOSIS — M5030 Other cervical disc degeneration, unspecified cervical region: Secondary | ICD-10-CM

## 2022-07-01 MED ORDER — GABAPENTIN 300 MG PO CAPS: 300.0000 mg | ORAL_CAPSULE | Freq: Three times a day (TID) | ORAL | 2 refills | Status: DC

## 2022-07-01 MED ORDER — PREDNISONE 50 MG PO TABS: ORAL_TABLET | ORAL | 0 refills | Status: DC

## 2022-07-01 MED ORDER — LOSARTAN POTASSIUM 50 MG PO TABS: 50.0000 mg | ORAL_TABLET | Freq: Every day | ORAL | 0 refills | Status: DC

## 2022-07-01 NOTE — Progress Notes (Signed)
Acute Office Visit  Subjective:     Patient ID: Jerome Chase, male    DOB: 1951-07-01, 71 y.o.   MRN: [id number]  No chief complaint on file.   HPI Patient is in today for new onset right sided headaches. Pt has hx of cervical DDD. No new injury. HA 2-3 a week for the last 1-2 weeks. Tylenol helps some. Only takes gabapentin at bedtime. Denies any vision changes, speech changes, upper ext strength changes. Headaches start in the neck and move across to the right side.   Not checking BP at home. No CP, palpitations, dizziness. Not taking any BP medication.    .. Active Ambulatory Problems    Diagnosis Date Noted   AAA (abdominal aortic aneurysm) (HCC) 10/28/2017   Abnormal EKG 10/28/2017   Chest pain 10/28/2017   Coronary artery disease involving coronary bypass graft of native heart with angina pectoris (HCC) 05/20/2017   Dyslipidemia 05/28/2017   Essential hypertension 07/06/2015   Ingrown nail 09/18/2017   Insomnia 07/16/2015   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   Dyspnea on effort 07/06/2015   Status post coronary artery stent placement 05/28/2017   Tailor's bunion of both feet 09/20/2017   Tenosynovitis of right foot 09/18/2017   Chronic heel pain, right 09/18/2017   Abnormal chest CT 05/28/2017   Primary osteoarthritis of left knee 10/28/2017   Left insertional Achilles tendinitis 10/28/2017   Calcaneal spur of foot, right 10/30/2017   Neck pain 10/30/2017   Screening for AAA (abdominal aortic aneurysm) 10/30/2017   Left knee pain 07/13/2018   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   Cervical radiculopathy 11/14/2020   SOB (shortness of breath) 09/16/2021   Numbness and tingling in left arm 09/16/2021   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) 09/16/2021   History of MI (myocardial infarction) 09/16/2021   Epigastric pain 09/16/2021   Mid back pain 09/16/2021   Erectile dysfunction 09/30/2021   Sinus bradycardia 05/07/2022    Pain radiating to back 05/07/2022   Belching 05/07/2022   Nausea 05/07/2022   H. pylori infection 05/09/2022   New onset of headaches 07/01/2022   Resolved Ambulatory Problems    Diagnosis Date Noted   No Resolved Ambulatory Problems   Past Medical History:  Diagnosis Date   Heart attack (HCC)    Hypertension      ROS See HPI.      Objective:    BP (!) 179/75   Pulse (!) 56   Ht 6\' 1"  (1.854 m)   Wt 214 lb (97.1 kg)   SpO2 97%   BMI 28.23 kg/m  BP Readings from Last 3 Encounters:  07/01/22 (!) 179/75  05/07/22 (!) 149/90  09/16/21 (!) 143/98   Wt Readings from Last 3 Encounters:  07/01/22 214 lb (97.1 kg)  05/07/22 212 lb (96.2 kg)  09/16/21 216 lb (98 kg)      Physical Exam Vitals reviewed.  Constitutional:      Appearance: Normal appearance. He is obese.  HENT:     Head: Normocephalic.  Cardiovascular:     Rate and Rhythm: Normal rate and regular rhythm.     Pulses: Normal pulses.  Pulmonary:     Effort: Pulmonary effort is normal.     Breath sounds: Normal breath sounds.  Abdominal:     General: Bowel sounds are normal.  Musculoskeletal:     Comments: NROM of neck No cervical tenderness to palpation over spine Tight and tender cervical paraspinal muscles more to right  than left Upper ext strength 5/5  Neurological:     General: No focal deficit present.     Mental Status: He is alert and oriented to person, place, and time.  Psychiatric:        Mood and Affect: Mood normal.          Assessment & Plan:  Marland KitchenMarland KitchenDiagnoses and all orders for this visit:  New onset of headaches -     predniSONE (DELTASONE) 50 MG tablet; One tab PO daily for 5 days. -     gabapentin (NEURONTIN) 300 MG capsule; Take 1 capsule (300 mg total) by mouth 3 (three) times daily.  DDD (degenerative disc disease), cervical -     predniSONE (DELTASONE) 50 MG tablet; One tab PO daily for 5 days. -     gabapentin (NEURONTIN) 300 MG capsule; Take 1 capsule (300 mg total)  by mouth 3 (three) times daily.  Neck pain -     predniSONE (DELTASONE) 50 MG tablet; One tab PO daily for 5 days. -     gabapentin (NEURONTIN) 300 MG capsule; Take 1 capsule (300 mg total) by mouth 3 (three) times daily.  Essential hypertension -     losartan (COZAAR) 50 MG tablet; Take 1 tablet (50 mg total) by mouth daily.   New HA but sound like coming from cervical DDD Burst of prednisone Increased gabapentin to 3 times a day, watch for sedation Gave good ROM exercises BP elevated Start cozaar 50mg  daily No red flag HA symptoms Follow up in 4 weeks  Iran Planas, PA-C

## 2022-07-01 NOTE — Patient Instructions (Addendum)
Gabapentin up to three times day.  Prednisone for 5 days Start losartan for blood pressure   Tension Headache, Adult A tension headache is a feeling of pain, pressure, or aching over the front and sides of the head. The pain can be dull, or it can feel tight. There are two types of tension headache: Episodic tension headache. This is when the headaches happen fewer than 15 days a month. Chronic tension headache. This is when the headaches happen more than 15 days a month during a 39-month period. A tension headache can last from 30 minutes to several days. It is the most common kind of headache. Tension headaches are not normally associated with nausea or vomiting, and they do not get worse with physical activity. What are the causes? The exact cause of this condition is not known. Tension headaches are often triggered by stress, anxiety, or depression. Other triggers may include: Alcohol. Too much caffeine or caffeine withdrawal. Respiratory infections, such as colds, flu, or sinus infections. Dental problems or teeth clenching. Fatigue. Holding your head and neck in the same position for a long period of time, such as while using a computer. Smoking. Arthritis of the neck. What are the signs or symptoms? Symptoms of this condition include: A feeling of pressure or tightness around the head. Dull, aching head pain. Pain over the front and sides of the head. Tenderness in the muscles of the head, neck, and shoulders. How is this diagnosed? This condition may be diagnosed based on your symptoms, your medical history, and a physical exam. If your symptoms are severe or unusual, you may have imaging tests, such as a CT scan or an MRI of your head. Your vision may also be checked. How is this treated? This condition may be treated with lifestyle changes and with medicines that help relieve symptoms. Follow these instructions at home: Managing pain Take over-the-counter and prescription  medicines only as told by your health care provider. When you have a headache, lie down in a dark, quiet room. If directed, put ice on your head and neck. To do this: Put ice in a plastic bag. Place a towel between your skin and the bag. Leave the ice on for 20 minutes, 2-3 times a day. Remove the ice if your skin turns bright red. This is very important. If you cannot feel pain, heat, or cold, you have a greater risk of damage to the area. If directed, apply heat to the back of your neck as often as told by your health care provider. Use the heat source that your health care provider recommends, such as a moist heat pack or a heating pad. Place a towel between your skin and the heat source. Leave the heat on for 20-30 minutes. Remove the heat if your skin turns bright red. This is especially important if you are unable to feel pain, heat, or cold. You have a greater risk of getting burned. Eating and drinking Eat meals on a regular schedule. If you drink alcohol: Limit how much you have to: 0-1 drink a day for women who are not pregnant. 0-2 drinks a day for men. Know how much alcohol is in your drink. In the [place] one drink equals one 12 oz bottle of beer (355 mL), one 5 oz glass of wine (148 mL), or one 1 oz glass of hard liquor (44 mL). Drink enough fluid to keep your urine pale yellow. Decrease your caffeine intake, or stop using caffeine. Lifestyle Get 7-9 hours  of sleep each night, or get the amount of sleep recommended by your health care provider. At bedtime, remove computers, phones, and tablets from your room. Find ways to manage your stress. This may include: Exercise. Deep breathing exercises. Yoga. Listening to music. Positive mental imagery. Try to sit up straight and avoid tensing your muscles. Do not use any products that contain nicotine or tobacco. These include cigarettes, chewing tobacco, and vaping devices, such as e-cigarettes. If you need help quitting, ask your  health care provider. General instructions  Avoid any headache triggers. Keep a journal to help find out what may trigger your headaches. For example, write down: What you eat and drink. How much sleep you get. Any change to your diet or medicines. Keep all follow-up visits. This is important. Contact a health care provider if: Your headache does not get better. Your headache comes back. You are sensitive to sounds, light, or smells because of a headache. You have nausea or you vomit. Your stomach hurts. Get help right away if: You suddenly develop a severe headache, along with any of the following: A stiff neck. Nausea and vomiting. Confusion. Weakness in one part or one side of your body. Double vision or loss of vision. Shortness of breath. Rash. Unusual sleepiness. Fever or chills. Trouble speaking. Pain in your eye or ear. Trouble walking or balancing. Feeling faint or passing out. Summary A tension headache is a feeling of pain, pressure, or aching over the front and sides of the head. A tension headache can last from 30 minutes to several days. It is the most common kind of headache. This condition may be diagnosed based on your symptoms, your medical history, and a physical exam. This condition may be treated with lifestyle changes and with medicines that help relieve symptoms. This information is not intended to replace advice given to you by your health care provider. Make sure you discuss any questions you have with your health care provider. Document Revised: 05/24/2020 Document Reviewed: 05/24/2020 Elsevier Patient Education  [place].

## 2022-07-07 ENCOUNTER — Encounter: Payer: Self-pay | Admitting: Physician Assistant

## 2022-07-27 ENCOUNTER — Other Ambulatory Visit: Payer: Self-pay | Admitting: Physician Assistant

## 2022-07-27 DIAGNOSIS — I10 Essential (primary) hypertension: Secondary | ICD-10-CM

## 2022-07-29 ENCOUNTER — Ambulatory Visit (INDEPENDENT_AMBULATORY_CARE_PROVIDER_SITE_OTHER): Payer: Medicare Other | Admitting: Family Medicine

## 2022-07-29 ENCOUNTER — Encounter: Payer: Self-pay | Admitting: Family Medicine

## 2022-07-29 VITALS — BP 154/89 | HR 59 | Ht 73.0 in | Wt 216.0 lb

## 2022-07-29 DIAGNOSIS — I10 Essential (primary) hypertension: Secondary | ICD-10-CM

## 2022-07-29 DIAGNOSIS — M542 Cervicalgia: Secondary | ICD-10-CM | POA: Diagnosis not present

## 2022-07-29 DIAGNOSIS — R519 Headache, unspecified: Secondary | ICD-10-CM | POA: Diagnosis not present

## 2022-07-29 MED ORDER — CYCLOBENZAPRINE HCL 7.5 MG PO TABS: 7.5000 mg | ORAL_TABLET | Freq: Two times a day (BID) | ORAL | 0 refills | Status: DC | PRN

## 2022-07-29 NOTE — Progress Notes (Signed)
New Patient Office Visit  Subjective    Patient ID: Jerome Chase, male    DOB: 1951/08/18  Age: 71 y.o. MRN: [id number]  CC:  Chief Complaint  Patient presents with   Establish Care    HPI DARIC KOREN presents to transfer of care.  He is here today for continued headaches. He has a hx of cervical degenerative disc disease. However he says that he is going to bed with these headaches and waking up with them. He was seen by Caleen Essex, PA, and given prednisone 50mg  and gabapentin. He only took one pill of the prednisone and stopped it. He was also started on cozaar 50mg  for BP to see if this helps with his headaches. He took tylenol and aleeve and this does help.   He has not had the C spine mri ordered by Dr. . He does admit to numbness and tingling down his arms. He has to take tylenol to give him relief with is headaches.   Outpatient Encounter Medications as of 07/29/2022  Medication Sig   aspirin EC 81 MG tablet Take 81 mg by mouth daily.   cyclobenzaprine (FEXMID) 7.5 MG tablet Take 1 tablet (7.5 mg total) by mouth 2 (two) times daily as needed for muscle spasms.   gabapentin (NEURONTIN) 300 MG capsule Take 1 capsule (300 mg total) by mouth 3 (three) times daily.   losartan (COZAAR) 50 MG tablet Take 1 tablet (50 mg total) by mouth daily. APPT FOR FURTHER REFILLS   omeprazole (PRILOSEC) 40 MG capsule Take 1 capsule (40 mg total) by mouth daily.   predniSONE (DELTASONE) 50 MG tablet One tab PO daily for 5 days.   rosuvastatin (CRESTOR) 40 MG tablet Take 1 tablet by mouth daily.   tadalafil (CIALIS) 5 MG tablet Take 5 mg by mouth daily.   No facility-administered encounter medications on file as of 07/29/2022.    Past Medical History:  Diagnosis Date   AAA (abdominal aortic aneurysm) (HCC) 10/28/2017   Abnormal EKG 10/28/2017   Coronary artery disease involving coronary bypass graft of native heart with angina pectoris (HCC) 05/20/2017   Dyslipidemia 05/28/2017    Essential hypertension 07/06/2015   Heart attack (HCC)    Hypertension    Status post coronary artery stent placement 05/28/2017    Past Surgical History:  Procedure Laterality Date   CARDIAC SURGERY      Family History  Problem Relation Age of Onset   Congestive Heart Failure Mother    Heart attack Mother    Cancer Father        Lung and stomach CA   Diabetes Brother        2 brothers   Heart attack Brother     Social History   Socioeconomic History   Marital status: Widowed    Spouse name: Macallan Ord   Number of children: 3   Years of education: Not on file   Highest education level: Associate degree: occupational, 05/30/2017, or vocational program  Occupational History   Occupation: [employer]: [employer]   Tobacco Use   Smoking status: Former    Types: Cigarettes    Quit date: 07/05/2005    Years since quitting: 17.0   Smokeless tobacco: Never  Vaping Use   Vaping Use: Never used  Substance and Sexual Activity   Alcohol use: No   Drug use: No   Sexual activity: Not Currently    Partners: Female  Birth control/protection: None  Other Topics Concern   Not on file  Social History Narrative   Not on file   Social Determinants of Health   Financial Resource Strain: Not on file  Food Insecurity: Not on file  Transportation Needs: Not on file  Physical Activity: Unknown (10/28/2017)   Exercise Vital Sign    Days of Exercise per Week: 0 days    Minutes of Exercise per Session: Not on file  Stress: Not on file  Social Connections: Not on file  Intimate Partner Violence: Not on file    Review of Systems  Constitutional:  Negative for chills and fever.  Respiratory:  Negative for cough and shortness of breath.   Cardiovascular:  Negative for chest pain.  Neurological:  Negative for headaches.        Objective    BP (!) 154/89   Pulse (!) 59   Ht 6\' 1"  (1.854 m)   Wt 216 lb (98 kg)   SpO2 99%   BMI 28.50 kg/m    Physical Exam Vitals and nursing note reviewed.  Constitutional:      General: He is not in acute distress.    Appearance: Normal appearance.  HENT:     Head: Normocephalic and atraumatic.     Right Ear: External ear normal.     Left Ear: External ear normal.     Nose: Nose normal.  Eyes:     Conjunctiva/sclera: Conjunctivae normal.  Cardiovascular:     Rate and Rhythm: Normal rate and regular rhythm.  Pulmonary:     Effort: Pulmonary effort is normal.     Breath sounds: Normal breath sounds.  Musculoskeletal:     Comments: 5/5 msk strength upper and lower extremities bilaterally - tight scm muscles b/l - negative spurlings b/l  Neurological:     General: No focal deficit present.     Mental Status: He is alert and oriented to person, place, and time.     Comments: CN II-XII intact bilaterally  Psychiatric:        Mood and Affect: Mood normal.        Behavior: Behavior normal.        Thought Content: Thought content normal.        Judgment: Judgment normal.         Assessment & Plan:   Problem List Items Addressed This Visit       Cardiovascular and Mediastinum   Essential hypertension    - BP elevated today. Pt in pain due to neck. Will have him monitor BP at home and report back with numbers to see if we need to increase cozaar.  - denies blurry vision and headaches today on exam        Other   Neck pain - Primary    - pt has not had MRI that was ordered by Dr. in April 2023 yet. I will inquire what happened to this order as I believe the headaches are neck related since they start at the c spine and radiate to the right side of his head.  - he does have tight SCM b/l which could likely be secondary to compensation for neck pain and this could very well be causing the pain.  - have gone ahead and given flexiril to take at bedtime to see if this relieves his symptoms. I believe this will help the tight muscles and may give him relief      Relevant  Medications   cyclobenzaprine (  FEXMID) 7.5 MG tablet   Generalized headaches    - likely related to C spine DDD - will do a trial of flexiril to see if this gives him relief. He has had relief with tylenol  - no red flag symptoms - will follow up after MRI      Relevant Medications   cyclobenzaprine (FEXMID) 7.5 MG tablet    Return in about 4 weeks (around 08/26/2022) for neck pain and BP follow up.   [name], DO

## 2022-07-29 NOTE — Assessment & Plan Note (Signed)
-   pt has not had MRI that was ordered by Dr. Karie Schwalbe in April 2023 yet. I will inquire what happened to this order as I believe the headaches are neck related since they start at the c spine and radiate to the right side of his head.  - he does have tight SCM b/l which could likely be secondary to compensation for neck pain and this could very well be causing the pain.  - have gone ahead and given flexiril to take at bedtime to see if this relieves his symptoms. I believe this will help the tight muscles and may give him relief

## 2022-07-29 NOTE — Patient Instructions (Signed)
Take muscle relaxant (flexiril) at bedtime to see if we can get you relief  I will follow up about the MRI to see what happened

## 2022-07-29 NOTE — Assessment & Plan Note (Signed)
-   BP elevated today. Pt in pain due to neck. Will have him monitor BP at home and report back with numbers to see if we need to increase cozaar.  - denies blurry vision and headaches today on exam

## 2022-07-29 NOTE — Assessment & Plan Note (Signed)
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 He has had relief with tylenol  - no red flag symptoms - will follow up after MRI

## 2022-07-30 ENCOUNTER — Encounter: Payer: Self-pay | Admitting: Family Medicine

## 2022-08-24 ENCOUNTER — Other Ambulatory Visit: Payer: Self-pay | Admitting: Physician Assistant

## 2022-08-24 DIAGNOSIS — I10 Essential (primary) hypertension: Secondary | ICD-10-CM

## 2022-09-05 ENCOUNTER — Other Ambulatory Visit: Payer: Self-pay | Admitting: Physician Assistant

## 2022-09-05 DIAGNOSIS — I10 Essential (primary) hypertension: Secondary | ICD-10-CM

## 2022-09-16 ENCOUNTER — Ambulatory Visit (INDEPENDENT_AMBULATORY_CARE_PROVIDER_SITE_OTHER): Payer: Medicare Other

## 2022-09-16 ENCOUNTER — Ambulatory Visit (INDEPENDENT_AMBULATORY_CARE_PROVIDER_SITE_OTHER): Payer: Medicare Other | Admitting: Sports Medicine

## 2022-09-16 ENCOUNTER — Encounter: Payer: Self-pay | Admitting: Sports Medicine

## 2022-09-16 VITALS — BP 161/82 | HR 74 | Wt 216.0 lb

## 2022-09-16 DIAGNOSIS — M47812 Spondylosis without myelopathy or radiculopathy, cervical region: Secondary | ICD-10-CM | POA: Diagnosis not present

## 2022-09-16 DIAGNOSIS — M1712 Unilateral primary osteoarthritis, left knee: Secondary | ICD-10-CM | POA: Diagnosis not present

## 2022-09-16 DIAGNOSIS — M5412 Radiculopathy, cervical region: Secondary | ICD-10-CM | POA: Diagnosis not present

## 2022-09-16 DIAGNOSIS — N529 Male erectile dysfunction, unspecified: Secondary | ICD-10-CM | POA: Diagnosis not present

## 2022-09-16 DIAGNOSIS — M542 Cervicalgia: Secondary | ICD-10-CM

## 2022-09-16 DIAGNOSIS — R200 Anesthesia of skin: Secondary | ICD-10-CM | POA: Diagnosis not present

## 2022-09-16 DIAGNOSIS — I10 Essential (primary) hypertension: Secondary | ICD-10-CM

## 2022-09-16 MED ORDER — VALSARTAN 160 MG PO TABS: 160.0000 mg | ORAL_TABLET | Freq: Every day | ORAL | 3 refills | Status: DC

## 2022-09-16 MED ORDER — ROSUVASTATIN CALCIUM 40 MG PO TABS: 40.0000 mg | ORAL_TABLET | Freq: Every day | ORAL | 3 refills | Status: DC

## 2022-09-16 NOTE — Assessment & Plan Note (Signed)
Did well after Orthovisc back in 2019, unfortunately he developed a degenerative meniscal tear, arthroscopic debridement helped for a while but now having recurrence of pain but also locking and catching, due to the mechanical symptoms I think he will need MRI and additional arthroscopic intervention so he will touch base with his orthopedic surgeon from [facility].

## 2022-09-16 NOTE — Assessment & Plan Note (Signed)
Pleasant 72 year old male with hypertension, currently losartan, blood pressure uncontrolled, switching to valsartan 160, he will check his blood pressures at home and send [place] a diary. Renal function is good. If unable to get his blood pressure controlled with 2 or 3 medications we would need to check him for sleep apnea.

## 2022-09-16 NOTE — Progress Notes (Addendum)
    Procedures performed today:    None.  Independent interpretation of notes and tests performed by another provider:   None.  Brief History, Exam, Impression, and Recommendations:    Essential hypertension Pleasant 72 year old male with hypertension, currently losartan , blood pressure uncontrolled, switching to valsartan  160, he will check his blood pressures at home and send us  a diary. Renal function is good. If unable to get his blood pressure controlled with 2 or 3 medications we would need to check him for sleep apnea.  Cervical radiculopathy [place] also has cervical spondylosis, chronic neck pain, minimal improvements with formal physical therapy, steroids, gabapentin . He has not yet gotten his MRI, he has failed greater than 6 weeks conservative treatment, x-rays not helpful, he will try to get his MRI scheduled at the end of the day today and I will order his epidural as soon as I see the results.  Update: Spondylitic changes at multiple levels from C3-C6 as expected, I am going to go ahead and order his epidural.  Primary osteoarthritis of left knee Did well after Orthovisc back in 2019, unfortunately he developed a degenerative meniscal tear, arthroscopic debridement helped for a while but now having recurrence of pain but also locking and catching, due to the mechanical symptoms I think he will need MRI and additional arthroscopic intervention so he will touch base with his orthopedic surgeon from [facility].  Erectile dysfunction [place] has struggled with erectile dysfunction, he has been on maximum dose Cialis , penile compressive ring, started TriMix in the quad mix injections. Nothing seems to work, he has not tried quad mix injections with a penile compressive ring so he will go ahead and try this next, he can follow this up with his urologist, he has no desire to do a penile prosthetic device surgery.    ____________________________________________ Debby PARAS.  Curtis, M.D., ABFM., CAQSM., AME. Primary Care and Sports Medicine [employer] [facility]  Adjunct Professor of [facility] Medicine  [employer]  Restaurant Manager, [employer] Food

## 2022-09-16 NOTE — Assessment & Plan Note (Signed)
Jerome Chase also has cervical spondylosis, chronic neck pain, minimal improvements with formal physical therapy, steroids, gabapentin. He has not yet gotten his MRI, he has failed greater than 6 weeks conservative treatment, x-rays not helpful, he will try to get his MRI scheduled at the end of the day today and I will order his epidural as soon as I see the results.  Update: Spondylitic changes at multiple levels from C3-C6 as expected, I am going to go ahead and order his epidural.

## 2022-09-16 NOTE — Assessment & Plan Note (Signed)
Jerome Chase has struggled with erectile dysfunction, he has been on maximum dose Cialis, penile compressive ring, started TriMix in the quad mix injections. Nothing seems to work, he has not tried quad mix injections with a penile compressive ring so he will go ahead and try this next, he can follow this up with his urologist, he has no desire to do a penile prosthetic device surgery.

## 2022-09-17 NOTE — Addendum Note (Signed)
Addended by: Silverio Decamp on: 09/17/2022 09:27 AM   Modules accepted: Orders

## 2022-09-25 DIAGNOSIS — I2510 Atherosclerotic heart disease of native coronary artery without angina pectoris: Secondary | ICD-10-CM | POA: Diagnosis not present

## 2022-09-25 DIAGNOSIS — I714 Abdominal aortic aneurysm, without rupture, unspecified: Secondary | ICD-10-CM | POA: Diagnosis not present

## 2022-09-25 DIAGNOSIS — I10 Essential (primary) hypertension: Secondary | ICD-10-CM | POA: Diagnosis not present

## 2022-09-25 DIAGNOSIS — I25709 Atherosclerosis of coronary artery bypass graft(s), unspecified, with unspecified angina pectoris: Secondary | ICD-10-CM | POA: Diagnosis not present

## 2022-09-30 ENCOUNTER — Ambulatory Visit
Admission: RE | Admit: 2022-09-30 | Discharge: 2022-09-30 | Disposition: A | Discharge status: Home / Self Care | Payer: Medicare Other | Source: Ambulatory Visit | Attending: Sports Medicine | Admitting: Sports Medicine

## 2022-09-30 DIAGNOSIS — M5412 Radiculopathy, cervical region: Secondary | ICD-10-CM | POA: Diagnosis not present

## 2022-09-30 MED ORDER — TRIAMCINOLONE ACETONIDE 40 MG/ML IJ SUSP (RADIOLOGY)
60.0000 mg | Freq: Once | INTRAMUSCULAR | Status: AC
  Administered 2022-09-30: 60 mg via EPIDURAL

## 2022-09-30 MED ORDER — IOPAMIDOL (ISOVUE-M 300) INJECTION 61%
1.0000 mL | Freq: Once | INTRAMUSCULAR | Status: AC | PRN
  Administered 2022-09-30: 1 mL via EPIDURAL

## 2022-09-30 NOTE — Discharge Instructions (Signed)

## 2022-10-22 ENCOUNTER — Other Ambulatory Visit: Payer: Self-pay | Admitting: Sports Medicine

## 2022-10-28 ENCOUNTER — Encounter: Payer: Self-pay | Admitting: Sports Medicine

## 2022-10-28 ENCOUNTER — Ambulatory Visit (INDEPENDENT_AMBULATORY_CARE_PROVIDER_SITE_OTHER): Payer: Medicare Other | Admitting: Sports Medicine

## 2022-10-28 VITALS — BP 125/71 | HR 53 | Wt 213.0 lb

## 2022-10-28 DIAGNOSIS — M5412 Radiculopathy, cervical region: Secondary | ICD-10-CM

## 2022-10-28 DIAGNOSIS — N529 Male erectile dysfunction, unspecified: Secondary | ICD-10-CM | POA: Diagnosis not present

## 2022-10-28 DIAGNOSIS — I10 Essential (primary) hypertension: Secondary | ICD-10-CM

## 2022-10-28 MED ORDER — TADALAFIL 5 MG PO TABS: 5.0000 mg | ORAL_TABLET | Freq: Every day | ORAL | 3 refills | Status: DC

## 2022-10-28 NOTE — Assessment & Plan Note (Signed)
At the last visit I switched him from losartan to valsartan, blood pressures perfect today. He did have an episode of hypotension that he describes more as a vagal episode with nausea, diaphoresis, with resolution and then feeling fine. He had no further episodes, he does drink Coke, tea, and beer and I advised him all of these were diuretics and he needs to replace whenever he drinks with Coke, tea, and beer with the same amount of water.

## 2022-10-28 NOTE — Assessment & Plan Note (Signed)
Overall doing well, needs a refill on tadalafil.

## 2022-10-28 NOTE — Assessment & Plan Note (Signed)
Multilevel cervical spondylosis on MRI, he did have his cervical epidural and reports good relief of his symptoms, he can call me back for repeat epidural if needed.

## 2022-10-28 NOTE — Progress Notes (Signed)
    Procedures performed today:    None.  Independent interpretation of notes and tests performed by another provider:   None.  Brief History, Exam, Impression, and Recommendations:    Cervical radiculopathy Multilevel cervical spondylosis on MRI, he did have his cervical epidural and reports good relief of his symptoms, he can call me back for repeat epidural if needed.  Erectile dysfunction Overall doing well, needs a refill on tadalafil.  Essential hypertension At the last visit I switched him from losartan to valsartan, blood pressures perfect today. He did have an episode of hypotension that he describes more as a vagal episode with nausea, diaphoresis, with resolution and then feeling fine. He had no further episodes, he does drink Coke, tea, and beer and I advised him all of these were diuretics and he needs to replace whenever he drinks with Coke, tea, and beer with the same amount of water.    ____________________________________________ Gwen Her. Dianah Field, M.D., ABFM., CAQSM., AME. Primary Care and Sports Medicine [place] [facility]  Adjunct Professor of [employer] [facility] [employer]  Risk manager

## 2022-11-03 DIAGNOSIS — N5201 Erectile dysfunction due to arterial insufficiency: Secondary | ICD-10-CM | POA: Diagnosis not present

## 2022-11-03 DIAGNOSIS — N400 Enlarged prostate without lower urinary tract symptoms: Secondary | ICD-10-CM | POA: Diagnosis not present

## 2022-12-31 ENCOUNTER — Ambulatory Visit: Payer: Medicare Other | Admitting: Family Medicine

## 2023-01-06 ENCOUNTER — Telehealth: Payer: Self-pay | Admitting: Family Medicine

## 2023-01-06 NOTE — Telephone Encounter (Signed)
Contacted Jerome Chase to schedule their annual wellness visit. Appointment made for 01/21/2023 at 9 AM.  Cira Servant Patient Access Advocate II Direct Dial: [phone]

## 2023-01-08 ENCOUNTER — Ambulatory Visit (INDEPENDENT_AMBULATORY_CARE_PROVIDER_SITE_OTHER): Payer: Medicare HMO | Admitting: Sports Medicine

## 2023-01-08 ENCOUNTER — Ambulatory Visit (INDEPENDENT_AMBULATORY_CARE_PROVIDER_SITE_OTHER): Payer: Medicare HMO

## 2023-01-08 DIAGNOSIS — M4807 Spinal stenosis, lumbosacral region: Secondary | ICD-10-CM | POA: Diagnosis not present

## 2023-01-08 DIAGNOSIS — M5136 Other intervertebral disc degeneration, lumbar region: Secondary | ICD-10-CM | POA: Diagnosis not present

## 2023-01-08 DIAGNOSIS — M1611 Unilateral primary osteoarthritis, right hip: Secondary | ICD-10-CM | POA: Insufficient documentation

## 2023-01-08 DIAGNOSIS — M25551 Pain in right hip: Secondary | ICD-10-CM | POA: Diagnosis not present

## 2023-01-08 DIAGNOSIS — M51369 Other intervertebral disc degeneration, lumbar region without mention of lumbar back pain or lower extremity pain: Secondary | ICD-10-CM | POA: Insufficient documentation

## 2023-01-08 DIAGNOSIS — M47816 Spondylosis without myelopathy or radiculopathy, lumbar region: Secondary | ICD-10-CM | POA: Diagnosis not present

## 2023-01-08 DIAGNOSIS — M5137 Other intervertebral disc degeneration, lumbosacral region: Secondary | ICD-10-CM | POA: Diagnosis not present

## 2023-01-08 MED ORDER — DEXAMETHASONE 4 MG PO TABS: 4.0000 mg | ORAL_TABLET | Freq: Three times a day (TID) | ORAL | 0 refills | Status: DC

## 2023-01-08 NOTE — Assessment & Plan Note (Signed)
Pain right groin with with internal rotation of the hip, adding x-rays, home physical therapy. Return to see me in 6 weeks for this, potential injection if not better.

## 2023-01-08 NOTE — Assessment & Plan Note (Signed)
Increasing back pain right-sided low back with radiation to the anterior thigh, nothing radicular down past the knee. Adding Decadron, patient declines prednisone, he will continue Flexeril, adding x-rays and home physical therapy, return to see me in 6 weeks, MR for interventional planning if not better.

## 2023-01-08 NOTE — Progress Notes (Signed)
    Procedures performed today:    None.  Independent interpretation of notes and tests performed by another provider:   None.  Brief History, Exam, Impression, and Recommendations:    Primary osteoarthritis of right hip Pain right groin with with internal rotation of the hip, adding x-rays, home physical therapy. Return to see me in 6 weeks for this, potential injection if not better.  Lumbar degenerative disc disease Increasing back pain right-sided low back with radiation to the anterior thigh, nothing radicular down past the knee. Adding Decadron, patient declines prednisone, he will continue Flexeril, adding x-rays and home physical therapy, return to see me in 6 weeks, MR for interventional planning if not better.    ____________________________________________ Ihor Austin. [name], M.D., ABFM., CAQSM., AME. Primary Care and Sports Medicine [place] [facility]  Adjunct Professor of Family Medicine  [employer]  Restaurant manager, fast food

## 2023-01-21 ENCOUNTER — Ambulatory Visit: Payer: Medicare HMO | Admitting: Family Medicine

## 2023-01-21 DIAGNOSIS — Z0000 Encounter for general adult medical examination without abnormal findings: Secondary | ICD-10-CM

## 2023-01-21 NOTE — Progress Notes (Signed)
MEDICARE ANNUAL WELLNESS VISIT  01/21/2023  Telephone Visit Disclaimer This Medicare AWV was conducted by telephone due to national recommendations for restrictions regarding the COVID-19 Pandemic (e.g. social distancing).  I verified, using two identifiers, that I am speaking with Jerome Chase or their authorized healthcare agent. I discussed the limitations, risks, security, and privacy concerns of performing an evaluation and management service by telephone and the potential availability of an in-person appointment in the future. The patient expressed understanding and agreed to proceed.  Location of Patient: Home Location of Provider (nurse):  In the office.  Subjective:    Jerome Chase is a 72 y.o. male patient of Wachs, Erika S, DO who had a Medicare Annual Wellness Visit today via telephone. Jerome Chase is Retired and lives with their family. he has 3 children. he reports that he is socially active and does interact with friends/family regularly. he is moderately physically active and enjoys travelling.  Patient Care Team: [name], DO as PCP - General (Family Medicine) Monica Becton, MD as Consulting Physician (Sports Medicine)     01/21/2023    8:26 AM 10/02/2021    9:32 AM 11/05/2017   10:22 AM  Advanced Directives  Does Patient Have a Medical Advance Directive? No No No  Would patient like information on creating a medical advance directive? No - Patient declined No - Patient declined No - Patient declined    Hospital Utilization Over the Past 12 Months: # of hospitalizations or ER visits: 0 # of surgeries: 0  Review of Systems    Patient reports that his overall health is unchanged compared to last year.  History obtained from chart review and the patient  Patient Reported Readings (BP, Pulse, CBG, Weight, etc) none  Pain Assessment Pain : 0-10 Pain Score: 5  Pain Type: Acute pain Pain Location: Back Pain Orientation: Lower, Right Pain  Descriptors / Indicators: Aching, Discomfort Pain Onset: More than a month ago Pain Frequency: Intermittent Pain Relieving Factors: rest  Pain Relieving Factors: rest  Current Medications & Allergies (verified) Allergies as of 01/21/2023       Reactions   Morphine Hives   Hydrocodone-acetaminophen Nausea And Vomiting   Percocet [oxycodone-acetaminophen] Nausea And Vomiting   Vicodin [hydrocodone-acetaminophen] Nausea And Vomiting        Medication List        Accurate as of Jan 21, 2023  8:40 AM. If you have any questions, ask your nurse or doctor.          aspirin EC 81 MG tablet Take 81 mg by mouth daily.   dexamethasone 4 MG tablet Commonly known as: DECADRON Take 1 tablet (4 mg total) by mouth 3 (three) times daily.   gabapentin 300 MG capsule Commonly known as: NEURONTIN Take 1 capsule (300 mg total) by mouth 3 (three) times daily.   omeprazole 40 MG capsule Commonly known as: PRILOSEC Take 1 capsule (40 mg total) by mouth daily.   rosuvastatin 40 MG tablet Commonly known as: CRESTOR Take 1 tablet (40 mg total) by mouth daily.   tadalafil 5 MG tablet Commonly known as: CIALIS Take 1 tablet (5 mg total) by mouth daily.   valsartan 160 MG tablet Commonly known as: DIOVAN Take 1 tablet (160 mg total) by mouth daily.        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/20/2017   Dyslipidemia 05/28/2017   Essential hypertension 07/06/2015   Heart attack (HCC)    Hypertension    Status post coronary artery stent placement 05/28/2017   Past Surgical History:  Procedure Laterality Date   CARDIAC SURGERY     Family History  Problem Relation Age of Onset   Congestive Heart Failure Mother    Heart attack Mother    Cancer Father        Lung and stomach CA    Diabetes Brother        2 brothers   Heart attack Brother    Social History   Socioeconomic History   Marital status: Widowed    Spouse name: Jordany Warters   Number of children: 3   Years of education: Not on file   Highest education level: Associate degree: occupational, Scientist, product/process development, or vocational program  Occupational History   Occupation: Sport and exercise psychologist: [employer]     Comment: Full-time  Tobacco Use   Smoking status: Former    Types: Cigarettes    Quit date: 07/05/2005    Years since quitting: 17.5   Smokeless tobacco: Never  Vaping Use   Vaping Use: Never used  Substance and Sexual Activity   Alcohol use: Not Currently    Comment: occasional beer with a meal   Drug use: No   Sexual activity: Not Currently    Partners: Female    Birth control/protection: None  Other Topics Concern   Not on file  Social History Narrative   Lives with three grandchildren. He has three children. He is still working full time. He enjoys travelling.   Social Determinants of Health   Financial Resource Strain: Low Risk  (01/21/2023)   Overall Financial Resource Strain (CARDIA)    Difficulty of Paying Living Expenses: Not hard at all  Food Insecurity: No Food Insecurity (01/21/2023)   Hunger Vital Sign    Worried About Running Out of Food in the Last Year: Never true    Ran Out of Food in the Last Year: Never true  Transportation Needs: No Transportation Needs (01/21/2023)   PRAPARE - Administrator, Civil Service (Medical): No    Lack of Transportation (Non-Medical): No  Physical Activity: Inactive (01/21/2023)   Exercise Vital Sign    Days of Exercise per Week: 0 days    Minutes of Exercise per Session: 0 min  Stress: No Stress Concern Present (01/21/2023)   [employer] of Occupational Health - Occupational Stress Questionnaire    Feeling of Stress : Only a little  Social Connections: Moderately Isolated (01/21/2023)   Social Connection and Isolation  Panel [NHANES]    Frequency of Communication with Friends and Family: More than three times a week    Frequency of Social Gatherings with Friends and Family: More than three times a week    Attends Religious Services: More than 4 times per year    Active Member of [employer] or Organizations: No    Attends Banker Meetings: Never    Marital Status: Widowed    Activities of Daily Living    01/21/2023    8:28 AM  In your present state of health, do you have any difficulty performing the following activities:  Hearing? 0  Vision? 0  Difficulty concentrating or making decisions? 0  Walking or climbing stairs? 0  Dressing or bathing? 0  Doing errands, shopping? 0  Preparing Food and eating ? N  Using the Toilet? N  In the past six months, have you accidently leaked urine? N  Do you have problems with loss of bowel control? N  Managing your Medications? N  Managing your Finances? N  Housekeeping or managing your Housekeeping? N    Patient Education/ Literacy How often do you need to have someone help you when you read instructions, pamphlets, or other written materials from your doctor or pharmacy?: 1 - Never What is the last grade level you completed in school?: 4 years of technical school  Exercise Current Exercise Habits: The patient has a physically strenuous job, but has no regular exercise apart from work.  Diet Patient reports consuming  1-3  meals a day and 1-3 snack(s) a day Patient reports that his primary diet is: Regular Patient reports that she does have regular access to food.   Depression Screen    01/21/2023    8:27 AM 07/29/2022    4:57 PM 10/28/2017   10:31 AM  PHQ 2/9 Scores  PHQ - 2 Score 0 0 2  PHQ- 9 Score   11     Fall Risk    01/21/2023    8:26 AM 07/29/2022    4:57 PM  Fall Risk   Falls in the past year? 0 0  Number falls in past yr: 0 0  Injury with Fall? 0 0  Risk for fall due to : No Fall Risks No Fall Risks  Follow up Falls  evaluation completed Falls evaluation completed     Objective:  Jerome Chase seemed alert and oriented and he participated appropriately during our telephone visit.  Blood Pressure Weight BMI  BP Readings from Last 3 Encounters:  10/28/22 125/71  09/30/22 128/83  09/16/22 (!) 161/82   Wt Readings from Last 3 Encounters:  10/28/22 213 lb (96.6 kg)  09/16/22 216 lb (98 kg)  07/29/22 216 lb (98 kg)   BMI Readings from Last 1 Encounters:  10/28/22 28.10 kg/m    *Unable to obtain current vital signs, weight, and BMI due to telephone visit type  Hearing/Vision  Jerome Chase did not seem to have difficulty with hearing/understanding during the telephone conversation Reports that he has had a formal eye exam by an eye care professional within the past year Reports that he has not had a formal hearing evaluation within the past year *Unable to fully assess hearing and vision during telephone visit type  Cognitive Function:    01/21/2023    8:30 AM  6CIT Screen  What Year? 0 points  What month? 0 points  What time? 0 points  Count back from 20 0 points  Months in reverse 0 points  Repeat phrase 0 points  Total Score 0 points   (Normal:0-7, Significant for Dysfunction: >8)  Normal Cognitive Function Screening: Yes   Immunization & Health Maintenance Record Immunization History  Administered Date(s) Administered   Influenza-Unspecified 08/08/2017   Janssen (J&J) SARS-COV-2 Vaccination 09/10/2020    Health Maintenance  Topic Date Due   DTaP/Tdap/Td (1 - Tdap) Never done   COVID-19 Vaccine (2 - 2023-24 season) 02/06/2023 (Originally 05/09/2022)   Zoster Vaccines- Shingrix (1 of 2) 04/23/2023 (Originally 04/04/2001)   Pneumonia Vaccine 59+ Years old (1 of 1 - PCV) 01/21/2024 (Originally 04/04/2016)   COLONOSCOPY (Pts 45-57yrs Insurance coverage will need to be confirmed)  01/21/2024 (Originally 04/04/1996)   INFLUENZA VACCINE  04/09/2023   Medicare Annual Wellness (AWV)   01/21/2024  Hepatitis C Screening  Completed   HPV VACCINES  Aged Out       Assessment  This is a routine wellness examination for Jerome Chase.  Health Maintenance: Due or Overdue Health Maintenance Due  Topic Date Due   DTaP/Tdap/Td (1 - Tdap) Never done    Jerome Chase does not need a referral for Community Assistance: Care Management:   no Social Work:    no Prescription Assistance:  no Nutrition/Diabetes Education:  no   Plan:  Personalized Goals  Goals Addressed               This Visit's Progress     Patient Stated (pt-stated)        Patient stated that he would like to loose some weight.       Personalized Health Maintenance & Screening Recommendations  Tetanus vaccine Shingles vaccine Pneumonia vaccine Colorectal cancer screening - options discussed with patient.   Lung Cancer Screening Recommended: no (Low Dose CT Chest recommended if Age 49-80 years, 30 pack-year currently smoking OR have quit w/in past 15 years) Hepatitis C Screening recommended: no HIV Screening recommended: no  Advanced Directives: Written information was not prepared per patient's request.  Referrals & Orders No orders of the defined types were placed in this encounter.   Follow-up Plan Follow-up with [name], DO as planned Schedule shingles vaccine and tetanus shot at the pharmacy.  Pneumonia vaccine can be done at the pharmacy or in the office.  Let us know what you decide about the colonoscopy, cologuard or FIT test.  Medicare wellness visit in one year.  Patient will access AVS on my chart.   I have personally reviewed and noted the following in the patient's chart:   Medical and social history Use of alcohol, tobacco or illicit drugs  Current medications and supplements Functional ability and status Nutritional status Physical activity Advanced directives List of other physicians Hospitalizations, surgeries, and ER visits in previous 12  months Vitals Screenings to include cognitive, depression, and falls Referrals and appointments  In addition, I have reviewed and discussed with Jerome Chase certain preventive protocols, quality metrics, and best practice recommendations. A written personalized care plan for preventive services as well as general preventive health recommendations is available and can be mailed to the patient at his request.      Modesto Charon, RN BSN  01/21/2023

## 2023-01-21 NOTE — Patient Instructions (Signed)
MEDICARE ANNUAL WELLNESS VISIT Health Maintenance Summary and Written Plan of Care  Jerome Chase ,  Thank you for allowing me to perform your Medicare Annual Wellness Visit and for your ongoing commitment to your health.   Health Maintenance & Immunization History Health Maintenance  Topic Date Due   DTaP/Tdap/Td (1 - Tdap) Never done   COVID-19 Vaccine (2 - 2023-24 season) 02/06/2023 (Originally 05/09/2022)   Zoster Vaccines- Shingrix (1 of 2) 04/23/2023 (Originally 04/04/2001)   Pneumonia Vaccine 81+ Years old (1 of 1 - PCV) 01/21/2024 (Originally 04/04/2016)   COLONOSCOPY (Pts 45-59yrs Insurance coverage will need to be confirmed)  01/21/2024 (Originally 04/04/1996)   INFLUENZA VACCINE  04/09/2023   Medicare Annual Wellness (AWV)  01/21/2024   Hepatitis C Screening  Completed   HPV VACCINES  Aged Out   Immunization History  Administered Date(s) Administered   Influenza-Unspecified 08/08/2017   Janssen (J&J) SARS-COV-2 Vaccination 09/10/2020    These are the patient goals that we discussed:  Goals Addressed               This Visit's Progress     Patient Stated (pt-stated)        Patient stated that he would like to loose some weight.         This is a list of Health Maintenance Items that are overdue or due now: Health Maintenance Due  Topic Date Due   DTaP/Tdap/Td (1 - Tdap) Never done   Tetanus vaccine Shingles vaccine Pneumonia vaccine Colorectal cancer screening - options discussed with patient.   Orders/Referrals Placed Today: No orders of the defined types were placed in this encounter.  (Contact our referral department at [phone] if you have not spoken with someone about your referral appointment within the next 5 days)    Follow-up Plan Follow-up with [name], DO as planned Schedule shingles vaccine and tetanus shot at the pharmacy.  Pneumonia vaccine can be done at the pharmacy or in the office.  Let us know what you decide about the  colonoscopy, cologuard or FIT test.  Medicare wellness visit in one year.  Patient will access AVS on my chart.     Health Maintenance, Male Adopting a healthy lifestyle and getting preventive care are important in promoting health and wellness. Ask your health care provider about: The right schedule for you to have regular tests and exams. Things you can do on your own to prevent diseases and keep yourself healthy. What should I know about diet, weight, and exercise? Eat a healthy diet  Eat a diet that includes plenty of vegetables, fruits, low-fat dairy products, and lean protein. Do not eat a lot of foods that are high in solid fats, added sugars, or sodium. Maintain a healthy weight Body mass index (BMI) is a measurement that can be used to identify possible weight problems. It estimates body fat based on height and weight. Your health care provider can help determine your BMI and help you achieve or maintain a healthy weight. Get regular exercise Get regular exercise. This is one of the most important things you can do for your health. Most adults should: Exercise for at least 150 minutes each week. The exercise should increase your heart rate and make you sweat (moderate-intensity exercise). Do strengthening exercises at least twice a week. This is in addition to the moderate-intensity exercise. Spend less time sitting. Even light physical activity can be beneficial. Watch cholesterol and blood lipids Have your blood tested for lipids and cholesterol  at 72 years of age, then have this test every 5 years. You may need to have your cholesterol levels checked more often if: Your lipid or cholesterol levels are high. You are older than 72 years of age. You are at high risk for heart disease. What should I know about cancer screening? Many types of cancers can be detected early and may often be prevented. Depending on your health history and family history, you may need to have cancer  screening at various ages. This may include screening for: Colorectal cancer. Prostate cancer. Skin cancer. Lung cancer. What should I know about heart disease, diabetes, and high blood pressure? Blood pressure and heart disease High blood pressure causes heart disease and increases the risk of stroke. This is more likely to develop in people who have high blood pressure readings or are overweight. Talk with your health care provider about your target blood pressure readings. Have your blood pressure checked: Every 3-5 years if you are 53-11 years of age. Every year if you are 72 years old or older. If you are between the ages of 9 and 31 and are a current or former smoker, ask your health care provider if you should have a one-time screening for abdominal aortic aneurysm (AAA). Diabetes Have regular diabetes screenings. This checks your fasting blood sugar level. Have the screening done: Once every three years after age 71 if you are at a normal weight and have a low risk for diabetes. More often and at a younger age if you are overweight or have a high risk for diabetes. What should I know about preventing infection? Hepatitis B If you have a higher risk for hepatitis B, you should be screened for this virus. Talk with your health care provider to find out if you are at risk for hepatitis B infection. Hepatitis C Blood testing is recommended for: Everyone born from [birth year] through 1965. Anyone with known risk factors for hepatitis C. Sexually transmitted infections (STIs) You should be screened each year for STIs, including gonorrhea and chlamydia, if: You are sexually active and are younger than 72 years of age. You are older than 72 years of age and your health care provider tells you that you are at risk for this type of infection. Your sexual activity has changed since you were last screened, and you are at increased risk for chlamydia or gonorrhea. Ask your health care provider if  you are at risk. Ask your health care provider about whether you are at high risk for HIV. Your health care provider may recommend a prescription medicine to help prevent HIV infection. If you choose to take medicine to prevent HIV, you should first get tested for HIV. You should then be tested every 3 months for as long as you are taking the medicine. Follow these instructions at home: Alcohol use Do not drink alcohol if your health care provider tells you not to drink. If you drink alcohol: Limit how much you have to 0-2 drinks a day. Know how much alcohol is in your drink. In the [place] one drink equals one 12 oz bottle of beer (355 mL), one 5 oz glass of wine (148 mL), or one 1 oz glass of hard liquor (44 mL). Lifestyle Do not use any products that contain nicotine or tobacco. These products include cigarettes, chewing tobacco, and vaping devices, such as e-cigarettes. If you need help quitting, ask your health care provider. Do not use street drugs. Do not share needles. Ask your  health care provider for help if you need support or information about quitting drugs. General instructions Schedule regular health, dental, and eye exams. Stay current with your vaccines. Tell your health care provider if: You often feel depressed. You have ever been abused or do not feel safe at home. Summary Adopting a healthy lifestyle and getting preventive care are important in promoting health and wellness. Follow your health care provider's instructions about healthy diet, exercising, and getting tested or screened for diseases. Follow your health care provider's instructions on monitoring your cholesterol and blood pressure. This information is not intended to replace advice given to you by your health care provider. Make sure you discuss any questions you have with your health care provider. Document Revised: 01/14/2021 Document Reviewed: 01/14/2021 Elsevier Patient Education  2023 [employer].

## 2023-01-29 ENCOUNTER — Other Ambulatory Visit: Payer: Self-pay | Admitting: Sports Medicine

## 2023-01-29 DIAGNOSIS — I10 Essential (primary) hypertension: Secondary | ICD-10-CM

## 2023-02-10 ENCOUNTER — Ambulatory Visit (INDEPENDENT_AMBULATORY_CARE_PROVIDER_SITE_OTHER): Payer: Medicare HMO | Admitting: Family Medicine

## 2023-02-10 ENCOUNTER — Encounter: Payer: Self-pay | Admitting: Family Medicine

## 2023-02-10 VITALS — BP 138/78 | HR 55 | Ht 72.0 in | Wt 212.8 lb

## 2023-02-10 DIAGNOSIS — M5136 Other intervertebral disc degeneration, lumbar region: Secondary | ICD-10-CM | POA: Diagnosis not present

## 2023-02-10 DIAGNOSIS — R0789 Other chest pain: Secondary | ICD-10-CM

## 2023-02-10 LAB — CBC
HCT: 44.2 % (ref 38.5–50.0)
Hemoglobin: 14.7 g/dL (ref 13.2–17.1)
WBC: 5.9 10*3/uL (ref 3.8–10.8)

## 2023-02-10 MED ORDER — DEXAMETHASONE 4 MG PO TABS
4.0000 mg | ORAL_TABLET | Freq: Three times a day (TID) | ORAL | 0 refills | Status: DC
Start: 2023-02-10 — End: 2023-02-24

## 2023-02-10 MED ORDER — PREDNISONE 20 MG PO TABS: 40.0000 mg | ORAL_TABLET | Freq: Every day | ORAL | 0 refills | Status: DC

## 2023-02-10 MED ORDER — KETOROLAC TROMETHAMINE 60 MG/2ML IM SOLN
60.0000 mg | Freq: Once | INTRAMUSCULAR | Status: AC
Start: 2023-02-10 — End: 2023-02-10
  Administered 2023-02-10: 60 mg via INTRAMUSCULAR

## 2023-02-10 MED ORDER — CYCLOBENZAPRINE HCL 5 MG PO TABS: 5.0000 mg | ORAL_TABLET | Freq: Every day | ORAL | 0 refills | Status: DC

## 2023-02-10 NOTE — Assessment & Plan Note (Signed)
-   pt having intermittent chest pain, said it is relieved with pepcid. Takes omeprazole as needed. I instructed him to try and take it prior to food in the morning to see if this helps - have also ordered blood work to look for metabolic etiology. Cbc, bmp, lipase  - have also ordered h pylori test since patient has a hx of it.  - EKG done in office and interpreted by myself as no st changes, sinus bradycardia. Some inverted t waves in v6 that are stable when compared to previous EKG. No signs of stemi or nstemi at this time on EKG

## 2023-02-10 NOTE — Progress Notes (Signed)
Established patient visit   Patient: Jerome Chase   DOB: 03-28-1951   72 y.o. Male  MRN: [id number] Visit Date: 02/10/2023  Today's healthcare provider: [name], DO   Chief Complaint  Patient presents with   Back Pain    Patient states was seen one month ago and dx with DDD -  was given exercises but he can not do them because the pain is so severe.   Chest Pain    Patient c/o chest pain that comes and goes x  a couple of weeks- he feels this may be gas as an antacid helps-  he states he has had heart trouble in past - with history of angina.     SUBJECTIVE    Chief Complaint  Patient presents with   Back Pain    Patient states was seen one month ago and dx with DDD -  was given exercises but he can not do them because the pain is so severe.   Chest Pain    Patient c/o chest pain that comes and goes x  a couple of weeks- he feels this may be gas as an antacid helps-  he states he has had heart trouble in past - with history of angina.    Back Pain Associated symptoms include chest pain. Pertinent negatives include no abdominal pain or fever.  Chest Pain  Associated symptoms include back pain. Pertinent negatives include no abdominal pain, cough, fever or shortness of breath.   Pt presents with severe back pain flare up. He is currently seen by Dr T. for lumbar degenerative changes. He is here today because he is having a bad flare up. He is having trouble walking or getting comfortable. No gait changes. He denies any loss of bowel or bladder function.   He is also having chest pain. He has a hx of heart disease. He said the pain feels more like indigestion. He does have a hx of h pylori treated in August and said he notes the pain is slightly different. He says pain has no correlation to sitting vs standing. He denies any jaw or arm pain.    Review of Systems  Constitutional:  Negative for activity change, fatigue and fever.  Respiratory:  Negative for cough and  shortness of breath.   Cardiovascular:  Positive for chest pain.  Gastrointestinal:  Negative for abdominal pain.  Genitourinary:  Negative for difficulty urinating.  Musculoskeletal:  Positive for back pain.       Current Meds  Medication Sig   aspirin EC 81 MG tablet Take 81 mg by mouth daily.   cyclobenzaprine (FLEXERIL) 5 MG tablet Take 1 tablet (5 mg total) by mouth at bedtime.   dexamethasone (DECADRON) 4 MG tablet Take 1 tablet (4 mg total) by mouth 3 (three) times daily.   gabapentin (NEURONTIN) 300 MG capsule Take 1 capsule (300 mg total) by mouth 3 (three) times daily.   omeprazole (PRILOSEC) 40 MG capsule Take 1 capsule (40 mg total) by mouth daily.   rosuvastatin (CRESTOR) 40 MG tablet Take 1 tablet (40 mg total) by mouth daily.   tadalafil (CIALIS) 5 MG tablet Take 1 tablet (5 mg total) by mouth daily.   valsartan (DIOVAN) 160 MG tablet TAKE 1 TABLET BY MOUTH DAILY   [DISCONTINUED] predniSONE (DELTASONE) 20 MG tablet Take 2 tablets (40 mg total) by mouth daily with breakfast for 7 days.    OBJECTIVE    BP 138/78  Pulse (!) 55   Ht 6' (1.829 m)   Wt 212 lb 12 oz (96.5 kg)   SpO2 99%   BMI 28.85 kg/m   Physical Exam Vitals and nursing note reviewed.  Constitutional:      General: He is not in acute distress.    Appearance: Normal appearance.  HENT:     Head: Normocephalic and atraumatic.     Right Ear: External ear normal.     Left Ear: External ear normal.     Nose: Nose normal.  Eyes:     Conjunctiva/sclera: Conjunctivae normal.  Cardiovascular:     Rate and Rhythm: Normal rate and regular rhythm.  Pulmonary:     Effort: Pulmonary effort is normal.     Breath sounds: Normal breath sounds.  Musculoskeletal:     Comments: Tenderness to palpation of bilateral lumbar paraspinal muscles   Neurological:     General: No focal deficit present.     Mental Status: He is alert and oriented to person, place, and time.  Psychiatric:        Mood and Affect: Mood  normal.        Behavior: Behavior normal.        Thought Content: Thought content normal.        Judgment: Judgment normal.        ASSESSMENT & PLAN    Problem List Items Addressed This Visit       Musculoskeletal and Integument   Lumbar degenerative disc disease    - pt in an acute flare up he can barely sit still and is leaning when trying to sit down - given toradol shot in clinic today - will go ahead and do a short course of decadron  - have refilled flexiril and encouraged pt to take it at night - have also asked pt to increase gabapentin to 600mg . He says he takes this once a day. He is agreeable to increase to 600mg   - I believe with the decadron, flexiril, toradol shot, and increase in gabapentin that we can get patient out of acute flare - I reminded/provided him with red flag symptoms to watch out for such as cauda equina sxs      Relevant Medications   cyclobenzaprine (FLEXERIL) 5 MG tablet   dexamethasone (DECADRON) 4 MG tablet     Other   Chest pain - Primary    - pt having intermittent chest pain, said it is relieved with pepcid. Takes omeprazole as needed. I instructed him to try and take it prior to food in the morning to see if this helps - have also ordered blood work to look for metabolic etiology. Cbc, bmp, lipase  - have also ordered h pylori test since patient has a hx of it.  - EKG done in office and interpreted by myself as no st changes, sinus bradycardia. Some inverted t waves in v6 that are stable when compared to previous EKG. No signs of stemi or nstemi at this time on EKG        Relevant Orders   EKG 12-Lead   Rhythm ECG, report   CBC   Lipase   BASIC METABOLIC PANEL WITH GFR   H. pylori breath test    Return has apt with dr t next week.      Meds ordered this encounter  Medications   ketorolac (TORADOL) injection 60 mg   DISCONTD: predniSONE (DELTASONE) 20 MG tablet    Sig: Take 2 tablets (40 mg  total) by mouth daily with breakfast  for 7 days.    Dispense:  14 tablet    Refill:  0   cyclobenzaprine (FLEXERIL) 5 MG tablet    Sig: Take 1 tablet (5 mg total) by mouth at bedtime.    Dispense:  30 tablet    Refill:  0   dexamethasone (DECADRON) 4 MG tablet    Sig: Take 1 tablet (4 mg total) by mouth 3 (three) times daily.    Dispense:  15 tablet    Refill:  0    Orders Placed This Encounter  Procedures   CBC   Lipase   BASIC METABOLIC PANEL WITH GFR   H. pylori breath test   EKG 12-Lead   Rhythm ECG, report     [name], DO  [facility] Primary Care & Sports Medicine at [facility] [phone] (phone) [phone] (fax)  [facility]

## 2023-02-10 NOTE — Assessment & Plan Note (Signed)
-   pt in an acute flare up he can barely sit still and is leaning when trying to sit down - given toradol shot in clinic today - will go ahead and do a short course of decadron  - have refilled flexiril and encouraged pt to take it at night - have also asked pt to increase gabapentin to 600mg . He says he takes this once a day. He is agreeable to increase to 600mg   - I believe with the decadron, flexiril, toradol shot, and increase in gabapentin that we can get patient out of acute flare - I reminded/provided him with red flag symptoms to watch out for such as cauda equina sxs

## 2023-02-10 NOTE — Patient Instructions (Signed)
Increase gabapentin to 600mg    Take the flexiril at night   Take the decadron three times a day

## 2023-02-11 LAB — CBC
MCHC: 33.3 g/dL (ref 32.0–36.0)
MCV: 88.4 fL (ref 80.0–100.0)
Platelets: 217 10*3/uL (ref 140–400)
RDW: 13.9 % (ref 11.0–15.0)

## 2023-02-11 LAB — BASIC METABOLIC PANEL WITH GFR
BUN: 15 mg/dL (ref 7–25)
CO2: 27 mmol/L (ref 20–32)
Chloride: 104 mmol/L (ref 98–110)
eGFR: 77 mL/min/{1.73_m2} (ref >=60)

## 2023-02-11 LAB — LIPASE: Lipase: 41 U/L (ref 7–60)

## 2023-02-16 LAB — BASIC METABOLIC PANEL WITH GFR
Calcium: 10 mg/dL (ref 8.6–10.3)
Creat: 1.04 mg/dL (ref 0.70–1.28)
Glucose, Bld: 91 mg/dL (ref 65–99)
Potassium: 4.6 mmol/L (ref 3.5–5.3)
Sodium: 140 mmol/L (ref 135–146)

## 2023-02-16 LAB — CBC
MCH: 29.4 pg (ref 27.0–33.0)
MPV: 10.2 fL (ref 7.5–12.5)
RBC: 5 10*6/uL (ref 4.20–5.80)

## 2023-02-16 LAB — H. PYLORI BREATH TEST: H. pylori Breath Test: NOT DETECTED

## 2023-02-19 ENCOUNTER — Ambulatory Visit: Payer: Medicare Other | Admitting: Sports Medicine

## 2023-02-19 IMAGING — DX DG CERVICAL SPINE COMPLETE 4+V
6 series · 6 of 6 positions shown · non-contrast
Comparison: None.

CLINICAL DATA: Posterior neck pain for radiating to the left for 1
week, initial encounter

EXAM:
CERVICAL SPINE - COMPLETE 4+ VIEW

[c-spine lat]
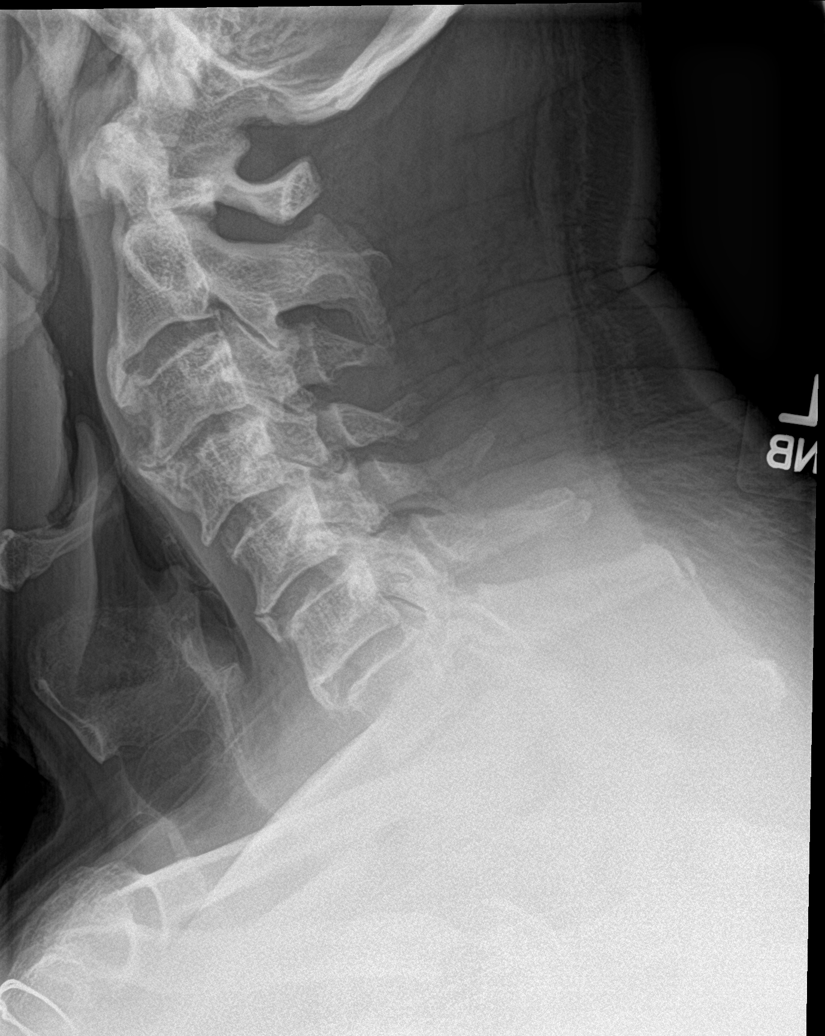

[c-spine obl (1 of 2)]
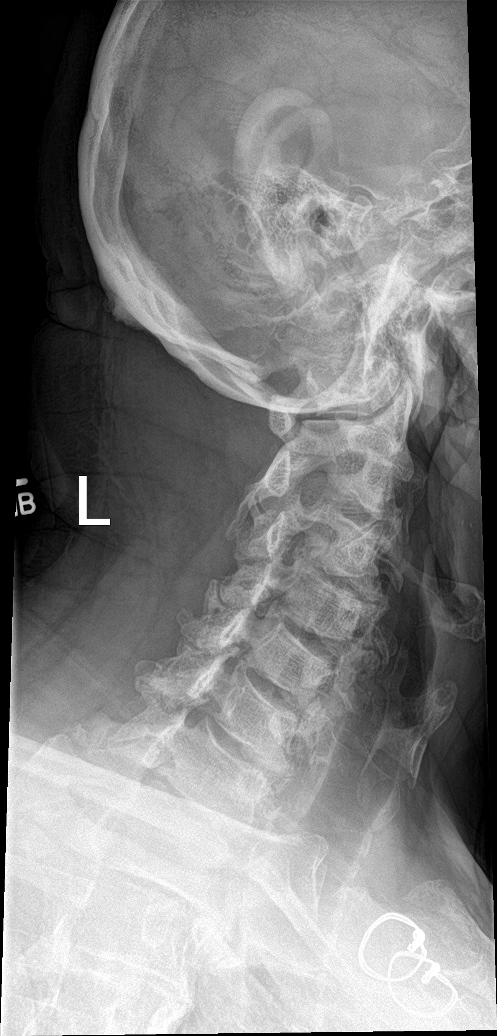

[c-spine obl (2 of 2)]
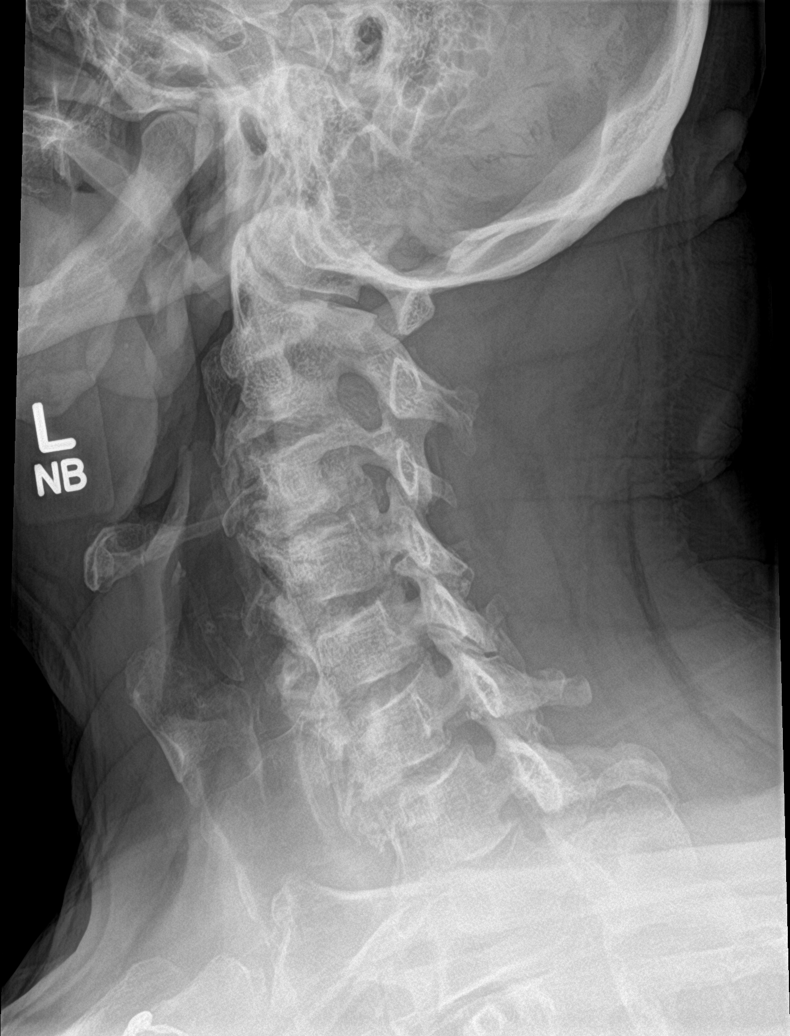

[c-spine ap]
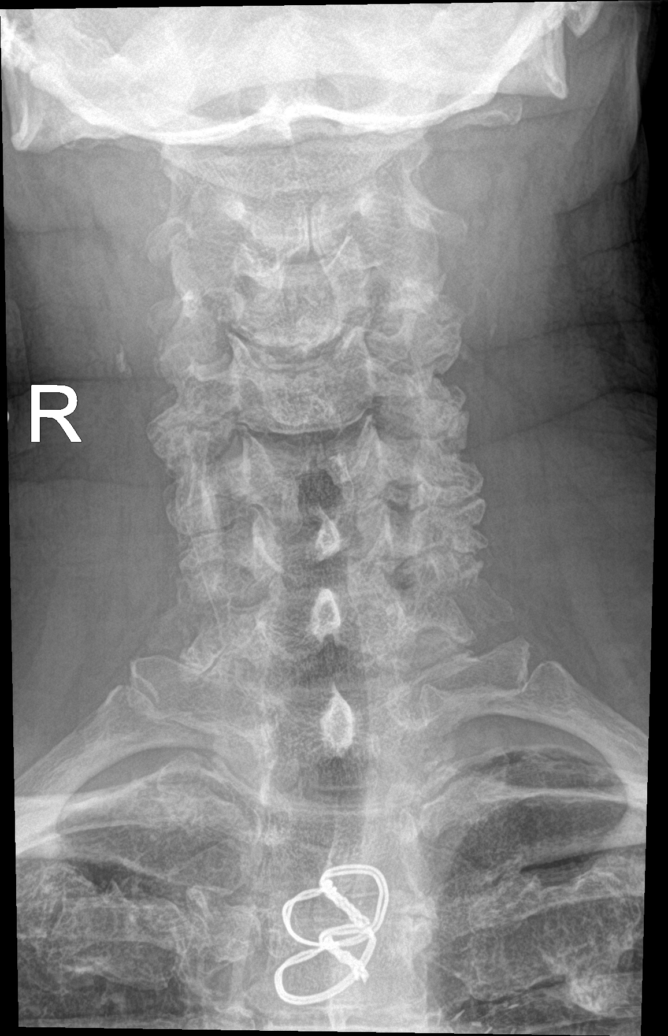

[c-spine open mouth]
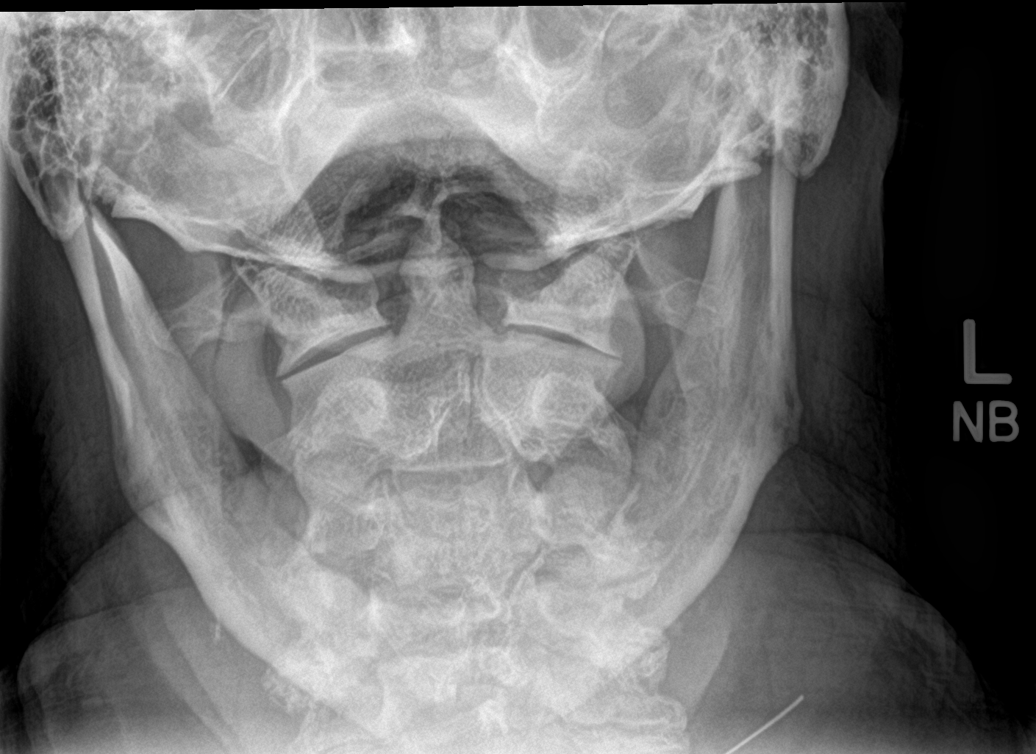

[c-spine swimmers]
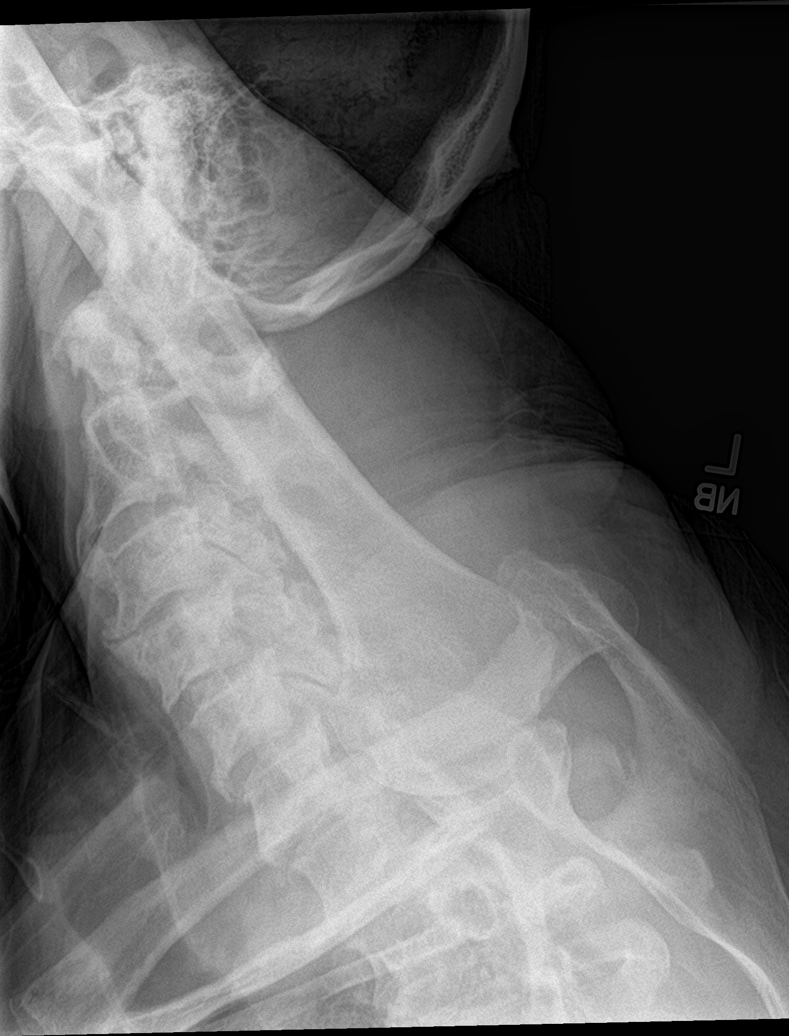

[6 of 6 positions shown; findings below may reference images not displayed]

FINDINGS: Seven cervical segments are well visualized. Multilevel osteophytic
changes are noted. Facet hypertrophic changes are seen as well. Mild
degenerative anterolisthesis of C4 on C5 and C5 on C6 is noted. Mild
neural foraminal narrowing is noted at multiple levels bilaterally.
No soft tissue abnormality is seen. The odontoid is within normal
limits.
IMPRESSION: Multilevel degenerative change with mild anterolisthesis and neural
foraminal narrowing as described.

## 2023-02-24 ENCOUNTER — Ambulatory Visit (INDEPENDENT_AMBULATORY_CARE_PROVIDER_SITE_OTHER): Payer: Medicare HMO | Admitting: Sports Medicine

## 2023-02-24 DIAGNOSIS — M542 Cervicalgia: Secondary | ICD-10-CM | POA: Diagnosis not present

## 2023-02-24 DIAGNOSIS — M5030 Other cervical disc degeneration, unspecified cervical region: Secondary | ICD-10-CM

## 2023-02-24 DIAGNOSIS — R519 Headache, unspecified: Secondary | ICD-10-CM | POA: Diagnosis not present

## 2023-02-24 DIAGNOSIS — M5136 Other intervertebral disc degeneration, lumbar region: Secondary | ICD-10-CM | POA: Diagnosis not present

## 2023-02-24 DIAGNOSIS — M51369 Other intervertebral disc degeneration, lumbar region without mention of lumbar back pain or lower extremity pain: Secondary | ICD-10-CM

## 2023-02-24 MED ORDER — GABAPENTIN 600 MG PO TABS
ORAL_TABLET | ORAL | 11 refills | Status: AC
Start: 2023-02-24 — End: ?

## 2023-02-24 NOTE — Assessment & Plan Note (Addendum)
This is a very pleasant 72 year old male, he has chronic axial low back pain, right-sided with radiation to the anterior mid thigh.  We have treated him conservatively, has had over 6 weeks of physical therapy, he is currently on gabapentin 600 in the morning, he did get some acute abortive treatment by his PCP earlier this month. Due to persistence of discomfort in spite of conservative treatment we will proceed with MRI for epidural planning, I do suspect he will be getting a mid lumbar right-sided epidural. Not getting any sedation so he will increase gabapentin to 600 mg twice daily for a week and then 3 times daily if needed. He would like me to go and set him up for the epidural soon as I see the results of the MRI.  Update: The dominant finding is a large L2-L3 disc herniation, proceeding with right-sided L2-L3 transforaminal epidural.  Considering the size of this disc extrusion I think we should have a low threshold for neurosurgical consultation.

## 2023-02-24 NOTE — Progress Notes (Addendum)
    Procedures performed today:    None.  Independent interpretation of notes and tests performed by another provider:   I did personally review the lumbar spine MRI, the dominant finding is a right-sided L2-L3 disc extrusion with inferior migration compressing the L3 nerve root.  Brief History, Exam, Impression, and Recommendations:    Lumbar degenerative disc disease This is a very pleasant 72 year old male, he has chronic axial low back pain, right-sided with radiation to the anterior mid thigh.  We have treated him conservatively, has had over 6 weeks of physical therapy, he is currently on gabapentin 600 in the morning, he did get some acute abortive treatment by his PCP earlier this month. Due to persistence of discomfort in spite of conservative treatment we will proceed with MRI for epidural planning, I do suspect he will be getting a mid lumbar right-sided epidural. Not getting any sedation so he will increase gabapentin to 600 mg twice daily for a week and then 3 times daily if needed. He would like me to go and set him up for the epidural soon as I see the results of the MRI.  Update: The dominant finding is a large L2-L3 disc herniation, proceeding with right-sided L2-L3 transforaminal epidural.  Considering the size of this disc extrusion I think we should have a low threshold for neurosurgical consultation.    ____________________________________________ Ihor Austin. [name], M.D., ABFM., CAQSM., AME. Primary Care and Sports Medicine [facility]  Adjunct Professor of Family Medicine  [place] [employer]  Restaurant manager, fast food

## 2023-03-03 ENCOUNTER — Ambulatory Visit (INDEPENDENT_AMBULATORY_CARE_PROVIDER_SITE_OTHER): Payer: Medicare HMO

## 2023-03-03 DIAGNOSIS — M48061 Spinal stenosis, lumbar region without neurogenic claudication: Secondary | ICD-10-CM | POA: Diagnosis not present

## 2023-03-03 DIAGNOSIS — M4807 Spinal stenosis, lumbosacral region: Secondary | ICD-10-CM | POA: Diagnosis not present

## 2023-03-03 DIAGNOSIS — M5136 Other intervertebral disc degeneration, lumbar region: Secondary | ICD-10-CM | POA: Diagnosis not present

## 2023-03-03 DIAGNOSIS — M5135 Other intervertebral disc degeneration, thoracolumbar region: Secondary | ICD-10-CM | POA: Diagnosis not present

## 2023-03-10 NOTE — Addendum Note (Signed)
Addended by: Monica Becton on: 03/10/2023 08:53 AM   Modules accepted: Orders

## 2023-03-23 NOTE — Discharge Instructions (Signed)
Post Procedure Spinal Discharge Instruction Sheet  You may resume a regular diet and any medications that you routinely take (including pain medications) unless otherwise noted by MD.  No driving day of procedure.  Light activity throughout the rest of the day.  Do not do any strenuous work, exercise, bending or lifting.  The day following the procedure, you can resume normal physical activity but you should refrain from exercising or physical therapy for at least three days thereafter.  You may apply ice to the injection site, 20 minutes on, 20 minutes off, as needed. Do not apply ice directly to skin.    Common Side Effects:  Headaches- take your usual medications as directed by your physician.  Increase your fluid intake.  Caffeinated beverages may be helpful.  Lie flat in bed until your headache resolves.  Restlessness or inability to sleep- you may have trouble sleeping for the next few days.  Ask your referring physician if you need any medication for sleep.  Facial flushing or redness- should subside within a few days.  Increased pain- a temporary increase in pain a day or two following your procedure is not unusual.  Take your pain medication as prescribed by your referring physician.  Leg cramps  Please contact our office at [phone] for the following symptoms: Fever greater than 100 degrees. Headaches unresolved with medication after 2-3 days. Increased swelling, pain, or redness at injection site.   Thank you for visiting [facility] today.   YOU MAY RESUME YOUR ASPRIN ANYTIME AFTER PROCEDURE TODAY

## 2023-03-24 ENCOUNTER — Ambulatory Visit
Admission: RE | Admit: 2023-03-24 | Discharge: 2023-03-24 | Disposition: A | Discharge status: Home / Self Care | Payer: Medicare HMO | Source: Ambulatory Visit | Attending: Sports Medicine | Admitting: Sports Medicine

## 2023-03-24 DIAGNOSIS — M5116 Intervertebral disc disorders with radiculopathy, lumbar region: Secondary | ICD-10-CM | POA: Diagnosis not present

## 2023-03-24 DIAGNOSIS — M4727 Other spondylosis with radiculopathy, lumbosacral region: Secondary | ICD-10-CM | POA: Diagnosis not present

## 2023-03-24 DIAGNOSIS — M5136 Other intervertebral disc degeneration, lumbar region: Secondary | ICD-10-CM

## 2023-03-24 MED ORDER — METHYLPREDNISOLONE ACETATE 40 MG/ML INJ SUSP (RADIOLOG
80.0000 mg | Freq: Once | INTRAMUSCULAR | Status: AC
  Administered 2023-03-24: 80 mg via EPIDURAL

## 2023-03-24 MED ORDER — IOPAMIDOL (ISOVUE-M 200) INJECTION 41%
1.0000 mL | Freq: Once | INTRAMUSCULAR | Status: AC
  Administered 2023-03-24: 1 mL via EPIDURAL

## 2023-04-02 DIAGNOSIS — I499 Cardiac arrhythmia, unspecified: Secondary | ICD-10-CM | POA: Diagnosis not present

## 2023-04-02 DIAGNOSIS — I10 Essential (primary) hypertension: Secondary | ICD-10-CM | POA: Diagnosis not present

## 2023-04-02 DIAGNOSIS — E7841 Elevated Lipoprotein(a): Secondary | ICD-10-CM | POA: Diagnosis not present

## 2023-04-02 DIAGNOSIS — R0602 Shortness of breath: Secondary | ICD-10-CM | POA: Diagnosis not present

## 2023-04-02 DIAGNOSIS — I25709 Atherosclerosis of coronary artery bypass graft(s), unspecified, with unspecified angina pectoris: Secondary | ICD-10-CM | POA: Diagnosis not present

## 2023-04-02 DIAGNOSIS — E7849 Other hyperlipidemia: Secondary | ICD-10-CM | POA: Diagnosis not present

## 2023-04-05 DIAGNOSIS — I498 Other specified cardiac arrhythmias: Secondary | ICD-10-CM | POA: Diagnosis not present

## 2023-04-09 ENCOUNTER — Ambulatory Visit (INDEPENDENT_AMBULATORY_CARE_PROVIDER_SITE_OTHER): Payer: Medicare HMO | Admitting: Sports Medicine

## 2023-04-09 ENCOUNTER — Encounter: Payer: Self-pay | Admitting: Sports Medicine

## 2023-04-09 VITALS — BP 135/71

## 2023-04-09 DIAGNOSIS — M51369 Other intervertebral disc degeneration, lumbar region without mention of lumbar back pain or lower extremity pain: Secondary | ICD-10-CM

## 2023-04-09 DIAGNOSIS — M5136 Other intervertebral disc degeneration, lumbar region: Secondary | ICD-10-CM

## 2023-04-09 NOTE — Assessment & Plan Note (Signed)
This is a very pleasant 72 year old male returns, chronic axial low back pain, right-sided with radiation anterior mid thigh, ultimately an MRI did show a very large L2-L3 disc extrusion with extension to the L2 and L3 nerve roots. After failure of conservative treatment we proceeded with a right-sided L2-L3 transforaminal epidural that has given him near complete relief, he is happy with how things are going and he can return to see me as needed, happy to order additional epidurals if he needs.

## 2023-04-09 NOTE — Progress Notes (Signed)
    Procedures performed today:    None.  Independent interpretation of notes and tests performed by another provider:   None.  Brief History, Exam, Impression, and Recommendations:    Lumbar degenerative disc disease This is a very pleasant 72 year old male returns, chronic axial low back pain, right-sided with radiation anterior mid thigh, ultimately an MRI did show a very large L2-L3 disc extrusion with extension to the L2 and L3 nerve roots. After failure of conservative treatment we proceeded with a right-sided L2-L3 transforaminal epidural that has given him near complete relief, he is happy with how things are going and he can return to see me as needed, happy to order additional epidurals if he needs.    ____________________________________________ Jerome Chase. [name], M.D., ABFM., CAQSM., AME. Primary Care and Sports Medicine [facility]  Adjunct Professor of Family Medicine  [place] [employer]  Restaurant manager, fast food

## 2023-04-15 DIAGNOSIS — Z951 Presence of aortocoronary bypass graft: Secondary | ICD-10-CM | POA: Diagnosis not present

## 2023-04-15 DIAGNOSIS — I214 Non-ST elevation (NSTEMI) myocardial infarction: Secondary | ICD-10-CM | POA: Diagnosis not present

## 2023-04-15 DIAGNOSIS — Z87891 Personal history of nicotine dependence: Secondary | ICD-10-CM | POA: Diagnosis not present

## 2023-04-15 DIAGNOSIS — R001 Bradycardia, unspecified: Secondary | ICD-10-CM | POA: Diagnosis not present

## 2023-04-15 DIAGNOSIS — J9811 Atelectasis: Secondary | ICD-10-CM | POA: Diagnosis not present

## 2023-04-15 DIAGNOSIS — E785 Hyperlipidemia, unspecified: Secondary | ICD-10-CM | POA: Diagnosis not present

## 2023-04-15 DIAGNOSIS — I714 Abdominal aortic aneurysm, without rupture, unspecified: Secondary | ICD-10-CM | POA: Diagnosis not present

## 2023-04-15 DIAGNOSIS — Z7982 Long term (current) use of aspirin: Secondary | ICD-10-CM | POA: Diagnosis not present

## 2023-04-15 DIAGNOSIS — R079 Chest pain, unspecified: Secondary | ICD-10-CM | POA: Diagnosis not present

## 2023-04-15 DIAGNOSIS — I509 Heart failure, unspecified: Secondary | ICD-10-CM | POA: Diagnosis not present

## 2023-04-15 DIAGNOSIS — Z79899 Other long term (current) drug therapy: Secondary | ICD-10-CM | POA: Diagnosis not present

## 2023-04-15 DIAGNOSIS — I517 Cardiomegaly: Secondary | ICD-10-CM | POA: Diagnosis not present

## 2023-04-15 DIAGNOSIS — I10 Essential (primary) hypertension: Secondary | ICD-10-CM | POA: Diagnosis not present

## 2023-04-15 DIAGNOSIS — I2510 Atherosclerotic heart disease of native coronary artery without angina pectoris: Secondary | ICD-10-CM | POA: Diagnosis not present

## 2023-04-15 DIAGNOSIS — I110 Hypertensive heart disease with heart failure: Secondary | ICD-10-CM | POA: Diagnosis not present

## 2023-04-16 DIAGNOSIS — I214 Non-ST elevation (NSTEMI) myocardial infarction: Secondary | ICD-10-CM | POA: Diagnosis not present

## 2023-04-16 DIAGNOSIS — R079 Chest pain, unspecified: Secondary | ICD-10-CM | POA: Diagnosis not present

## 2023-04-17 ENCOUNTER — Telehealth: Payer: Self-pay | Admitting: *Deleted

## 2023-04-17 ENCOUNTER — Encounter: Payer: Self-pay | Admitting: *Deleted

## 2023-04-17 DIAGNOSIS — R9431 Abnormal electrocardiogram [ECG] [EKG]: Secondary | ICD-10-CM | POA: Diagnosis not present

## 2023-04-17 NOTE — Transitions of Care (Post Inpatient/ED Visit) (Signed)
04/17/2023  Name: Jerome Chase MRN: [id number] DOB: April 15, 1951  Today's TOC FU Call Status: Today's TOC FU Call Status:: Successful TOC FU Call Completed TOC FU Call Complete Date: 04/17/23  Transition Care Management Follow-up Telephone Call Date of Discharge: 04/16/23 Discharge Facility: Other ([facility]) Name of Other ([facility]) [facility]: [facility] Type of Discharge: Inpatient Admission Primary Inpatient Discharge Diagnosis:: STEMI with PCI- no stent How have you been since you were released from the hospital?: Better ("I am doing okay I guess; I don't know why they did not schedule me with my cardiologist until another month goes by. I still work, and am very independent.  Not having any problems at the moment") Any questions or concerns?: Yes Patient Questions/Concerns:: No post-hospital discharge cardiology appointment scheduled until 05/20/23- has [facility] cardiology provider Patient Questions/Concerns Addressed: Other: (care coordination--facilitated scheduling HFU with PCP on Monday 04/20/23)  Items Reviewed: Did you receive and understand the discharge instructions provided?: Yes (briefly reviewed with patient who verbalizes good understanding of same - outside hospital AVS) Medications obtained,verified, and reconciled?: Yes (Medications Reviewed) (Full medication reconciliation/ review completed; no concerns or discrepancies identified; confirmed patient obtained/ is taking all newly Rx'd medications as instructed; self-manages medications and denies questions/ concerns around medications today) Any new allergies since your discharge?: No Dietary orders reviewed?: Yes Type of Diet Ordered:: "Healthy as possible" Do you have support at home?: Yes People in Home: spouse Name of Support/Comfort Primary Source: Reports independent in self-care activities; spouse assists as/ if needed/ indicated  Medications Reviewed Today: Medications Reviewed Today      Reviewed by Michaela Corner, RN (Registered Nurse) on 04/17/23 at 1236  Med List Status: <None>   Medication Order Taking? Sig Documenting Provider Last Dose Status Informant  aspirin EC 81 MG tablet [id number] Yes Take 81 mg by mouth daily. [provider] Taking Active   carvedilol (COREG) 3.125 MG tablet [id number] Yes Take 3.125 mg by mouth 2 (two) times daily with a meal. 04/17/23: reports during [facility] call this was prescribed at time of hospital discharge from [facility] on 04/16/23 [name], DO Taking Active Self           Med Note Michaela Corner   Fri Apr 17, 2023 12:35 PM) 04/17/23: reports during [facility] call this was prescribed at time of hospital discharge from [facility] on 04/16/23   clopidogrel (PLAVIX) 75 MG tablet [id number] Yes Take 75 mg by mouth daily. 04/17/23: reports during [facility] call this was prescribed at time of hospital discharge from [facility] on 04/16/23 [name], DO Taking Active Self           Med Note Michaela Corner   Fri Apr 17, 2023 12:35 PM) 04/17/23: reports during [facility] call this was prescribed at time of hospital discharge from [facility] on 04/16/23   cyclobenzaprine (FLEXERIL) 5 MG tablet [id number] No Take 1 tablet (5 mg total) by mouth at bedtime.  Patient not taking: Reported on 04/17/2023   [name], DO Not Taking Active   docusate sodium (COLACE) 100 MG capsule [id number] No Take 100 mg by mouth 2 (two) times daily. [name], DO Unknown Active Self           Med Note Michaela Corner   Fri Apr 17, 2023 12:33 PM) 04/17/23: reports during [facility] call this was prescribed at time of hospital discharge from [facility] on 04/16/23- reports he is not sure if he is  going to take or not- considering; will talk to PCP/ cardiologist before deciding  ezetimibe (ZETIA) 10 MG tablet [id number] Yes Take 10 mg by mouth daily. 04/17/23: reports during [facility] call this was prescribed at time of hospital discharge from [facility] on 04/16/23 [name], DO Taking  Active Self           Med Note Michaela Corner   Fri Apr 17, 2023 12:36 PM) 04/17/23: reports during [facility] call this was prescribed at time of hospital discharge from [facility] on 04/16/23   gabapentin (NEURONTIN) 600 MG tablet [id number] No 1 tablet p.o. twice daily for a week then 1 tablet p.o. 3 times daily if needed  Patient not taking: Reported on 04/17/2023   Monica Becton, MD Not Taking Active   omeprazole (PRILOSEC) 40 MG capsule [id number] Yes Take 1 capsule (40 mg total) by mouth daily. Jomarie Longs, PA-C Taking Active   rosuvastatin (CRESTOR) 40 MG tablet [id number] Yes Take 1 tablet (40 mg total) by mouth daily. Monica Becton, MD Taking Active   tadalafil (CIALIS) 5 MG tablet [id number] Yes Take 1 tablet (5 mg total) by mouth daily. Monica Becton, MD Taking Active   valsartan (DIOVAN) 160 MG tablet [id number] Yes TAKE 1 TABLET BY MOUTH DAILY Monica Becton, MD Taking Active            Med Note Michaela Corner   Fri Apr 17, 2023 12:34 PM) 04/17/23: reports during [facility] call this was decreased to 80 mg po every day at time of hospital discharge from [facility] on 04/16/23           Home Care and Equipment/Supplies: Were Home Health Services Ordered?: No Any new equipment or medical supplies ordered?: No  Functional Questionnaire: Do you need assistance with bathing/showering or dressing?: No Do you need assistance with meal preparation?: No Do you need assistance with eating?: No Do you have difficulty maintaining continence: No Do you need assistance with getting out of bed/getting out of a chair/moving?: No Do you have difficulty managing or taking your medications?: No  Follow up appointments reviewed: PCP Follow-up appointment confirmed?: Yes (care coordination outreach in real-time with scheduling care guide to successfully schedule hospital follow up PCP appointment 04/20/23) Date of PCP follow-up appointment?: 04/20/23 Follow-up Provider:  PCP Specialist Hospital Follow-up appointment confirmed?: Yes Date of Specialist follow-up appointment?: 05/20/23 (verified this is recommended time frame for follow up per hospital discharging provider notes) Follow-Up Specialty Provider:: [facility] cardiology provider Do you need transportation to your follow-up appointment?: No Do you understand care options if your condition(s) worsen?: Yes-patient verbalized understanding  SDOH Interventions Today    Flowsheet Row Most Recent Value  SDOH Interventions   Food Insecurity Interventions Intervention Not Indicated  Transportation Interventions Intervention Not Indicated  [drives self]      TOC Interventions Today    Flowsheet Row Most Recent Value  TOC Interventions   TOC Interventions Discussed/Reviewed TOC Interventions Discussed, Arranged PCP follow up within 7 days/Care Guide scheduled  [Patient declines need for ongoing/ further care coordination outreach,  no care coordination needs identified at time of TOC call today]      Interventions Today    Flowsheet Row Most Recent Value  Chronic Disease   Chronic disease during today's visit Other  [STEMI with PCI]  General Interventions   General Interventions Discussed/Reviewed General Interventions Discussed, Durable Medical Equipment (DME), Doctor Visits  Doctor Visits Discussed/Reviewed Doctor Visits Discussed, PCP,  Specialist  Durable Medical Equipment (DME) Other  [confirmed not currently requiring/ using assistive devices]  PCP/Specialist Visits Compliance with follow-up visit  Exercise Interventions   Exercise Discussed/Reviewed Exercise Discussed  [cardiac rehabilitation- [facility] referral]  Education Interventions   Education Provided Provided Education  Provided Verbal Education On Medication  [purpose of newly prescribed medications post-hospital discharge on 04/16/23]  Nutrition Interventions   Nutrition Discussed/Reviewed Nutrition Discussed  Pharmacy  Interventions   Pharmacy Dicussed/Reviewed Pharmacy Topics Discussed  [Full medication review with updating medication list in EHR per patient report]      Caryl Pina, RN, BSN, CCRN Alumnus RN CM Care Coordination/ Transition of Care- [facility] Care Management [phone]: direct office

## 2023-04-20 ENCOUNTER — Ambulatory Visit (INDEPENDENT_AMBULATORY_CARE_PROVIDER_SITE_OTHER): Payer: Medicare HMO | Admitting: Family Medicine

## 2023-04-20 ENCOUNTER — Encounter: Payer: Self-pay | Admitting: Family Medicine

## 2023-04-20 VITALS — BP 128/67 | HR 60 | Resp 18 | Ht 72.0 in | Wt 213.5 lb

## 2023-04-20 DIAGNOSIS — Z9861 Coronary angioplasty status: Secondary | ICD-10-CM | POA: Diagnosis not present

## 2023-04-20 DIAGNOSIS — Z09 Encounter for follow-up examination after completed treatment for conditions other than malignant neoplasm: Secondary | ICD-10-CM

## 2023-04-20 DIAGNOSIS — I10 Essential (primary) hypertension: Secondary | ICD-10-CM

## 2023-04-20 DIAGNOSIS — I2510 Atherosclerotic heart disease of native coronary artery without angina pectoris: Secondary | ICD-10-CM | POA: Insufficient documentation

## 2023-04-20 MED ORDER — NITROGLYCERIN 0.4 MG SL SUBL: 0.4000 mg | SUBLINGUAL_TABLET | SUBLINGUAL | 0 refills | Status: DC | PRN

## 2023-04-20 NOTE — Patient Instructions (Signed)
Call cardiology.

## 2023-04-20 NOTE — Assessment & Plan Note (Signed)
Pt notes decreased bp since leaving hospital. Will go ahead and have him record blood pressure and follow up in one week for bp  - for now hold valsartan 80mg 

## 2023-04-20 NOTE — Assessment & Plan Note (Signed)
-   pt presents for hospital discharge follow up.  - given pt prescription of nitroglycerin

## 2023-04-20 NOTE — Progress Notes (Signed)
Established patient visit   Patient: Jerome Chase   DOB: 04-Dec-1950   72 y.o. Male  MRN: [id number] Visit Date: 04/20/2023  Today's healthcare provider: [name], DO   Chief Complaint  Patient presents with   Hospitalization Follow-up    SUBJECTIVE    Chief Complaint  Patient presents with   Hospitalization Follow-up   HPI  Pt presents for hospital discharge follow up on NSTEMI. He has a pmh of CAD with CABG in 2014, s/p PCI, HLD, HTN and AAA. During hospitalization pt taken to cath lab and had LIMA to RCA, saphenous graft to PDA, severe stenosis of first large marginal branch with successful angioplasty. Started on DAPT, statin, and coreg. Has not yet seen cardiology.  Review of Systems  Constitutional:  Negative for activity change, fatigue and fever.  Respiratory:  Negative for cough and shortness of breath.   Cardiovascular:  Negative for chest pain.  Gastrointestinal:  Negative for abdominal pain.  Genitourinary:  Negative for difficulty urinating.       Current Meds  Medication Sig   aspirin EC 81 MG tablet Take 81 mg by mouth daily.   bisacodyl (DULCOLAX) 5 MG EC tablet Take by mouth.   carvedilol (COREG) 3.125 MG tablet Take 3.125 mg by mouth 2 (two) times daily with a meal. 04/17/23: reports during [facility] call this was prescribed at time of hospital discharge from [facility] on 04/16/23   clopidogrel (PLAVIX) 75 MG tablet Take 75 mg by mouth daily. 04/17/23: reports during [facility] call this was prescribed at time of hospital discharge from [facility] on 04/16/23   cyclobenzaprine (FLEXERIL) 5 MG tablet Take 1 tablet (5 mg total) by mouth at bedtime.   docusate sodium (COLACE) 100 MG capsule Take 100 mg by mouth 2 (two) times daily.   ezetimibe (ZETIA) 10 MG tablet Take 10 mg by mouth daily. 04/17/23: reports during [facility] call this was prescribed at time of hospital discharge from [facility] on 04/16/23   gabapentin (NEURONTIN) 600 MG tablet 1 tablet p.o. twice  daily for a week then 1 tablet p.o. 3 times daily if needed   nitroGLYCERIN (NITROSTAT) 0.4 MG SL tablet Place 1 tablet (0.4 mg total) under the tongue every 5 (five) minutes as needed for chest pain.   omeprazole (PRILOSEC) 40 MG capsule Take 1 capsule (40 mg total) by mouth daily.   rosuvastatin (CRESTOR) 40 MG tablet Take 1 tablet (40 mg total) by mouth daily.   tadalafil (CIALIS) 5 MG tablet Take 1 tablet (5 mg total) by mouth daily.   valsartan (DIOVAN) 160 MG tablet TAKE 1 TABLET BY MOUTH DAILY    OBJECTIVE    BP 128/67 (BP Location: Left Arm, Patient Position: Sitting, Cuff Size: Large)   Pulse 60   Resp 18   Ht 6' (1.829 m)   Wt 213 lb 8 oz (96.8 kg)   SpO2 97%   BMI 28.96 kg/m   Physical Exam Vitals and nursing note reviewed.  Constitutional:      General: He is not in acute distress.    Appearance: Normal appearance.  HENT:     Head: Normocephalic and atraumatic.     Right Ear: External ear normal.     Left Ear: External ear normal.     Nose: Nose normal.  Eyes:     Conjunctiva/sclera: Conjunctivae normal.  Cardiovascular:     Rate and Rhythm: Normal rate and regular rhythm.  Pulmonary:  Effort: Pulmonary effort is normal.     Breath sounds: Normal breath sounds.  Neurological:     General: No focal deficit present.     Mental Status: He is alert and oriented to person, place, and time.  Psychiatric:        Mood and Affect: Mood normal.        Behavior: Behavior normal.        Thought Content: Thought content normal.        Judgment: Judgment normal.          ASSESSMENT & PLAN    Problem List Items Addressed This Visit       Cardiovascular and Mediastinum   Essential hypertension - Primary    Pt notes decreased bp since leaving hospital. Will go ahead and have him record blood pressure and follow up in one week for bp  - for now hold valsartan 80mg       Relevant Medications   nitroGLYCERIN (NITROSTAT) 0.4 MG SL tablet   CAD S/P percutaneous  coronary angioplasty    - pt presents for hospital discharge follow up.  - given pt prescription of nitroglycerin      Relevant Medications   nitroGLYCERIN (NITROSTAT) 0.4 MG SL tablet   Other Visit Diagnoses     Hospital discharge follow-up           Return in about 1 week (around 04/27/2023) for htn follow up.      Meds ordered this encounter  Medications   nitroGLYCERIN (NITROSTAT) 0.4 MG SL tablet    Sig: Place 1 tablet (0.4 mg total) under the tongue every 5 (five) minutes as needed for chest pain.    Dispense:  30 tablet    Refill:  0    No orders of the defined types were placed in this encounter.    [name], DO  [facility] at [facility] [phone] (phone) [phone] (fax)  [facility]

## 2023-04-23 DIAGNOSIS — R001 Bradycardia, unspecified: Secondary | ICD-10-CM | POA: Diagnosis not present

## 2023-04-23 DIAGNOSIS — Z79899 Other long term (current) drug therapy: Secondary | ICD-10-CM | POA: Diagnosis not present

## 2023-04-23 DIAGNOSIS — I2510 Atherosclerotic heart disease of native coronary artery without angina pectoris: Secondary | ICD-10-CM | POA: Diagnosis not present

## 2023-04-23 DIAGNOSIS — R072 Precordial pain: Secondary | ICD-10-CM | POA: Diagnosis not present

## 2023-04-23 DIAGNOSIS — I10 Essential (primary) hypertension: Secondary | ICD-10-CM | POA: Diagnosis not present

## 2023-04-23 DIAGNOSIS — Z955 Presence of coronary angioplasty implant and graft: Secondary | ICD-10-CM | POA: Diagnosis not present

## 2023-04-23 DIAGNOSIS — R918 Other nonspecific abnormal finding of lung field: Secondary | ICD-10-CM | POA: Diagnosis not present

## 2023-04-23 DIAGNOSIS — I25112 Atherosclerotic heart disease of native coronary artery with refractory angina pectoris: Secondary | ICD-10-CM | POA: Diagnosis not present

## 2023-04-23 DIAGNOSIS — Z87891 Personal history of nicotine dependence: Secondary | ICD-10-CM | POA: Diagnosis not present

## 2023-04-23 DIAGNOSIS — I222 Subsequent non-ST elevation (NSTEMI) myocardial infarction: Secondary | ICD-10-CM | POA: Diagnosis not present

## 2023-04-23 DIAGNOSIS — I25118 Atherosclerotic heart disease of native coronary artery with other forms of angina pectoris: Secondary | ICD-10-CM | POA: Diagnosis not present

## 2023-04-23 DIAGNOSIS — Z7982 Long term (current) use of aspirin: Secondary | ICD-10-CM | POA: Diagnosis not present

## 2023-04-23 DIAGNOSIS — I214 Non-ST elevation (NSTEMI) myocardial infarction: Secondary | ICD-10-CM | POA: Diagnosis not present

## 2023-04-23 DIAGNOSIS — R9431 Abnormal electrocardiogram [ECG] [EKG]: Secondary | ICD-10-CM | POA: Diagnosis not present

## 2023-04-23 DIAGNOSIS — R079 Chest pain, unspecified: Secondary | ICD-10-CM | POA: Diagnosis not present

## 2023-04-23 DIAGNOSIS — Z7902 Long term (current) use of antithrombotics/antiplatelets: Secondary | ICD-10-CM | POA: Diagnosis not present

## 2023-04-23 DIAGNOSIS — Z951 Presence of aortocoronary bypass graft: Secondary | ICD-10-CM | POA: Diagnosis not present

## 2023-04-23 DIAGNOSIS — E785 Hyperlipidemia, unspecified: Secondary | ICD-10-CM | POA: Diagnosis not present

## 2023-04-23 DIAGNOSIS — I200 Unstable angina: Secondary | ICD-10-CM | POA: Diagnosis not present

## 2023-04-23 DIAGNOSIS — R911 Solitary pulmonary nodule: Secondary | ICD-10-CM | POA: Diagnosis not present

## 2023-04-23 DIAGNOSIS — R0789 Other chest pain: Secondary | ICD-10-CM | POA: Diagnosis not present

## 2023-04-23 DIAGNOSIS — Z9861 Coronary angioplasty status: Secondary | ICD-10-CM | POA: Diagnosis not present

## 2023-04-27 ENCOUNTER — Telehealth: Payer: Self-pay | Admitting: *Deleted

## 2023-04-27 ENCOUNTER — Encounter: Payer: Self-pay | Admitting: Family Medicine

## 2023-04-27 ENCOUNTER — Ambulatory Visit (INDEPENDENT_AMBULATORY_CARE_PROVIDER_SITE_OTHER): Payer: Medicare HMO | Admitting: Family Medicine

## 2023-04-27 ENCOUNTER — Other Ambulatory Visit: Payer: Self-pay | Admitting: Sports Medicine

## 2023-04-27 ENCOUNTER — Encounter: Payer: Self-pay | Admitting: *Deleted

## 2023-04-27 VITALS — BP 119/69 | HR 60 | Ht 72.0 in | Wt 210.0 lb

## 2023-04-27 DIAGNOSIS — I10 Essential (primary) hypertension: Secondary | ICD-10-CM

## 2023-04-27 NOTE — Transitions of Care (Post Inpatient/ED Visit) (Unsigned)
04/27/2023  Name: Jerome Chase MRN: [id number] DOB: October 01, 1950  Today's TOC FU Call Status: Today's TOC FU Call Status:: Successful TOC FU Call Completed TOC FU Call Complete Date: 04/27/23  Transition Care Management Follow-up Telephone Call Date of Discharge: 04/25/23 Discharge Facility: Other ([facility]) [facility] of [facility]: [facility] Type of Discharge: Inpatient Admission Primary Inpatient Discharge Diagnosis:: Chest pain; PCI How have you been since you were released from the hospital?: Better ("I am much better; I had yet another blockage-- and this timem they were able to give me a stent.  I sa the doctor this afternoon, she updated my medications, so I don't need to review them again.  I will start back to work soon") Any questions or concerns?: No  Items Reviewed: Did you receive and understand the discharge instructions provided?: Yes (briefly reviewed with patient who verbalizes good understanding of same - outside hospital AVS) Medications obtained,verified, and reconciled?: Partial Review Completed (confirmed patient obtained/ is taking all newly Rx'd medications as instructed; self-manages medications and denies questions/ concerns around medications today) Reason for Partial Mediation Review: Patient declined-- stated he had PCP office visit earlier today and medications were reviewed at that time Any new allergies since your discharge?: No Dietary orders reviewed?: Yes Type of Diet Ordered:: "Heart Healthy, low salt" Do you have support at home?: Yes People in Home: grandchild(ren) Name of Support/Comfort Primary Source: Reports independent in self-care activities; resides with his grandson; son and his family assists as/ if needed/ indicated  Medications Reviewed Today: Medications Reviewed Today     Reviewed by Michaela Corner, RN (Registered Nurse) on 04/27/23 at 1643  Med List Status: <None>   Medication Order Taking? Sig  Documenting Provider Last Dose Status Informant  aspirin EC 81 MG tablet [id number]  Take 81 mg by mouth daily. [provider]  Active   bisacodyl (DULCOLAX) 5 MG EC tablet [id number]  Take by mouth. [provider]  Active   carvedilol (COREG) 3.125 MG tablet [id number]  Take 3.125 mg by mouth 2 (two) times daily with a meal. 04/17/23: reports during [facility] call this was prescribed at time of hospital discharge from [facility] on 04/16/23 [name], DO  Active Self           Med Note Michaela Corner   Fri Apr 17, 2023 12:35 PM) 04/17/23: reports during [facility] call this was prescribed at time of hospital discharge from [facility] on 04/16/23   clopidogrel (PLAVIX) 75 MG tablet [id number]  Take 75 mg by mouth daily. 04/17/23: reports during [facility] call this was prescribed at time of hospital discharge from [facility] on 04/16/23 [name], DO  Active Self           Med Note Michaela Corner   Fri Apr 17, 2023 12:35 PM) 04/17/23: reports during [facility] call this was prescribed at time of hospital discharge from [facility] on 04/16/23   cyclobenzaprine (FLEXERIL) 5 MG tablet [id number]  Take 1 tablet (5 mg total) by mouth at bedtime.  Patient not taking: Reported on 04/27/2023   [name], DO  Active   docusate sodium (COLACE) 100 MG capsule [id number]  Take 100 mg by mouth 2 (two) times daily.  Patient not taking: Reported on 04/27/2023   [name], DO  Active Self           Med Note Michaela Corner   Fri Apr 17, 2023 12:33 PM)  04/17/23: reports during [facility] call this was prescribed at time of hospital discharge from [facility] on 04/16/23- reports he is not sure if he is going to take or not- considering; will talk to PCP/ cardiologist before deciding  ezetimibe (ZETIA) 10 MG tablet [id number] Yes Take 10 mg by mouth daily. 04/17/23: reports during [facility] call this was prescribed at time of hospital discharge from [facility] on 04/16/23 [name], DO Taking Active Self           Med  Note Michaela Corner   Fri Apr 17, 2023 12:36 PM) 04/17/23: reports during [facility] call this was prescribed at time of hospital discharge from [facility] on 04/16/23   gabapentin (NEURONTIN) 600 MG tablet [id number]  1 tablet p.o. twice daily for a week then 1 tablet p.o. 3 times daily if needed Monica Becton, MD  Active   ISOSORBIDE PO [id number] Yes Take by mouth. [provider] Taking Active            Med Note Marilu Favre Apr 27, 2023  4:43 PM) 04/27/23: Reported during [facility] call this was started at time of hospital discharge from [facility] 04/25/23  nitroGLYCERIN (NITROSTAT) 0.4 MG SL tablet [id number]  Place 1 tablet (0.4 mg total) under the tongue every 5 (five) minutes as needed for chest pain. [name], DO  Active   omeprazole (PRILOSEC) 40 MG capsule [id number]  Take 1 capsule (40 mg total) by mouth daily. Jomarie Longs, PA-C  Active   rosuvastatin (CRESTOR) 40 MG tablet [id number] Yes Take 1 tablet (40 mg total) by mouth daily. Monica Becton, MD Taking Active            Med Note Marilu Favre Apr 27, 2023  4:43 PM) 04/27/23: Reported during [facility] call this was started at time of hospital discharge from [facility] 04/25/23   tadalafil (CIALIS) 5 MG tablet [id number]  Take 1 tablet (5 mg total) by mouth daily. Monica Becton, MD  Active   valsartan (DIOVAN) 160 MG tablet [id number]  TAKE 1 TABLET BY MOUTH DAILY Monica Becton, MD  Active            Home Care and Equipment/Supplies: Were Home Health Services Ordered?: No Any new equipment or medical supplies ordered?: No  Functional Questionnaire: Do you need assistance with bathing/showering or dressing?: No Do you need assistance with meal preparation?: No Do you need assistance with eating?: No Do you have difficulty maintaining continence: No Do you need assistance with getting out of bed/getting out of a chair/moving?: No Do you have difficulty managing or taking your  medications?: No  Follow up appointments reviewed: PCP Follow-up appointment confirmed?: Yes Date of PCP follow-up appointment?: 04/27/23 (confirmed attended as scheduled) Follow-up Provider: PCP Specialist Hospital Follow-up appointment confirmed?: Yes Date of Specialist follow-up appointment?: 05/20/23 Follow-Up Specialty Provider:: [facility] cardiology provider Do you need transportation to your follow-up appointment?: No Do you understand care options if your condition(s) worsen?: Yes-patient verbalized understanding  SDOH Interventions Today    Flowsheet Row Most Recent Value  SDOH Interventions   Food Insecurity Interventions Intervention Not Indicated  Transportation Interventions Intervention Not Indicated  [continues to drive self]       TOC Interventions Today    Flowsheet Row Most Recent Value  TOC Interventions   TOC Interventions Discussed/Reviewed TOC Interventions Discussed  [Patient declines need for ongoing/ further care coordination outreach,  no care  coordination needs identified at time of TOC call today]      Interventions Today    Flowsheet Row Most Recent Value  Chronic Disease   Chronic disease during today's visit Other  [CAD with PCI]  General Interventions   General Interventions Discussed/Reviewed General Interventions Discussed, Doctor Visits, Durable Medical Equipment (DME)  Doctor Visits Discussed/Reviewed Doctor Visits Discussed, PCP, Specialist, Doctor Visits Reviewed  [reviewed PCP HFU OV earlier this afternoon- patient verbalizes good understanding of same]  Durable Medical Equipment (DME) Other  [confirmed not currently requiring/ using assistive devices]  PCP/Specialist Visits Compliance with follow-up visit  Education Interventions   Education Provided Provided Education  [purpose of/ importance of having updated DPR completed]  Nutrition Interventions   Nutrition Discussed/Reviewed Nutrition Discussed  Pharmacy Interventions    Pharmacy Dicussed/Reviewed Pharmacy Topics Discussed      Caryl Pina, RN, BSN, CCRN Alumnus RN CM Care Coordination/ Transition of Care- [facility] Care Management [phone]: direct office  ***

## 2023-04-27 NOTE — Assessment & Plan Note (Signed)
-   will continue current management of holding olmesartan. Pt started on isosorbide by cardiology for angina and bp doing well

## 2023-04-27 NOTE — Progress Notes (Unsigned)
Established patient visit   Patient: Jerome Chase   DOB: 1951/03/24   72 y.o. Male  MRN: [id number] Visit Date: 04/27/2023  Today's healthcare provider: [name], DO   Chief Complaint  Patient presents with   Blood Pressure Check    SUBJECTIVE    Chief Complaint  Patient presents with   Blood Pressure Check   HPI  Pt presents for follow up on HTN. At last visit, bp was low and valsartan 80mg  was stopped. Today, pt BP is well controlled.  Since last visit he was seen in the ED again and admitted with stent placement   Review of Systems  Constitutional:  Negative for activity change, fatigue and fever.  Respiratory:  Negative for cough and shortness of breath.   Cardiovascular:  Negative for chest pain.  Gastrointestinal:  Negative for abdominal pain.  Genitourinary:  Negative for difficulty urinating.       Current Meds  Medication Sig   aspirin EC 81 MG tablet Take 81 mg by mouth daily.   bisacodyl (DULCOLAX) 5 MG EC tablet Take by mouth.   carvedilol (COREG) 3.125 MG tablet Take 3.125 mg by mouth 2 (two) times daily with a meal. 04/17/23: reports during [facility] call this was prescribed at time of hospital discharge from [facility] on 04/16/23   clopidogrel (PLAVIX) 75 MG tablet Take 75 mg by mouth daily. 04/17/23: reports during [facility] call this was prescribed at time of hospital discharge from [facility] on 04/16/23   ezetimibe (ZETIA) 10 MG tablet Take 10 mg by mouth daily. 04/17/23: reports during [facility] call this was prescribed at time of hospital discharge from [facility] on 04/16/23   gabapentin (NEURONTIN) 600 MG tablet 1 tablet p.o. twice daily for a week then 1 tablet p.o. 3 times daily if needed   ISOSORBIDE PO Take by mouth.   nitroGLYCERIN (NITROSTAT) 0.4 MG SL tablet Place 1 tablet (0.4 mg total) under the tongue every 5 (five) minutes as needed for chest pain.   omeprazole (PRILOSEC) 40 MG capsule Take 1 capsule (40 mg total) by mouth daily.    rosuvastatin (CRESTOR) 40 MG tablet Take 1 tablet (40 mg total) by mouth daily.   tadalafil (CIALIS) 5 MG tablet Take 1 tablet (5 mg total) by mouth daily.   [DISCONTINUED] valsartan (DIOVAN) 160 MG tablet TAKE 1 TABLET BY MOUTH DAILY    OBJECTIVE    There were no vitals taken for this visit.  Physical Exam Vitals and nursing note reviewed.  Constitutional:      General: He is not in acute distress.    Appearance: Normal appearance.  HENT:     Head: Normocephalic and atraumatic.     Right Ear: External ear normal.     Left Ear: External ear normal.     Nose: Nose normal.  Eyes:     Conjunctiva/sclera: Conjunctivae normal.  Cardiovascular:     Rate and Rhythm: Normal rate and regular rhythm.  Pulmonary:     Effort: Pulmonary effort is normal.     Breath sounds: Normal breath sounds.  Neurological:     General: No focal deficit present.     Mental Status: He is alert and oriented to person, place, and time.  Psychiatric:        Mood and Affect: Mood normal.        Behavior: Behavior normal.        Thought Content: Thought content normal.  Judgment: Judgment normal.          ASSESSMENT & PLAN    Problem List Items Addressed This Visit       Cardiovascular and Mediastinum   Essential hypertension - Primary    - will continue current management of holding olmesartan. Pt started on isosorbide by cardiology for angina and bp doing well      Relevant Medications   ISOSORBIDE PO    Return in about 3 months (around 07/28/2023).      No orders of the defined types were placed in this encounter.   No orders of the defined types were placed in this encounter.    [name], DO  [facility] Medicine at [facility] [phone] (phone) [phone] (fax)  [facility]

## 2023-04-28 NOTE — Transitions of Care (Post Inpatient/ED Visit) (Signed)
04/28/2023  Name: Jerome Chase MRN: [id number] DOB: [birth year]  Today's TOC FU Call Status: Today's TOC FU Call Status:: Successful TOC FU Call Completed TOC FU Call Complete Date: 04/27/23  Transition Care Management Follow-up Telephone Call Date of Discharge: 04/25/23 Discharge Facility: Other ([facility]) Name of Other ([facility]) Discharge Facility: [facility] Type of Discharge: Inpatient Admission Primary Inpatient Discharge Diagnosis:: Chest pain; PCI How have you been since you were released from the hospital?: Better ("I am much better; I had yet another blockage-- and this timem they were able to give me a stent.  I sa the doctor this afternoon, she updated my medications, so I don't need to review them again.  I will start back to work soon") Any questions or concerns?: No  Items Reviewed: Did you receive and understand the discharge instructions provided?: Yes (briefly reviewed with patient who verbalizes good understanding of same - outside hospital AVS) Medications obtained,verified, and reconciled?: Partial Review Completed (confirmed patient obtained/ is taking all newly Rx'd medications as instructed; self-manages medications and denies questions/ concerns around medications today) Reason for Partial Mediation Review: Patient declined-- stated he had PCP office visit earlier today and medications were reviewed at that time Any new allergies since your discharge?: No Dietary orders reviewed?: Yes Type of Diet Ordered:: "Heart Healthy, low salt" Do you have support at home?: Yes People in Home: grandchild(ren) Name of Support/Comfort Primary Source: Reports independent in self-care activities; resides with his grandson; son and his family assists as/ if needed/ indicated  Medications Reviewed Today: Medications Reviewed Today     Reviewed by Michaela Corner, RN (Registered Nurse) on 04/27/23 at 1643  Med List Status: <None>   Medication Order Taking? Sig  Documenting Provider Last Dose Status Informant  aspirin EC 81 MG tablet [id number]  Take 81 mg by mouth daily. [provider]  Active   bisacodyl (DULCOLAX) 5 MG EC tablet [id number]  Take by mouth. [provider]  Active   carvedilol (COREG) 3.125 MG tablet [id number]  Take 3.125 mg by mouth 2 (two) times daily with a meal. 04/17/23: reports during [facility] call this was prescribed at time of hospital discharge from [facility] on 04/16/23 [name], DO  Active Self           Med Note Michaela Corner   Fri Apr 17, 2023 12:35 PM) 04/17/23: reports during [facility] call this was prescribed at time of hospital discharge from [facility] on 04/16/23   clopidogrel (PLAVIX) 75 MG tablet [id number]  Take 75 mg by mouth daily. 04/17/23: reports during [facility] call this was prescribed at time of hospital discharge from [facility] on 04/16/23 [name], DO  Active Self           Med Note Michaela Corner   Fri Apr 17, 2023 12:35 PM) 04/17/23: reports during [facility] call this was prescribed at time of hospital discharge from [facility] on 04/16/23   cyclobenzaprine (FLEXERIL) 5 MG tablet [id number]  Take 1 tablet (5 mg total) by mouth at bedtime.  Patient not taking: Reported on 04/27/2023   [name], DO  Active   docusate sodium (COLACE) 100 MG capsule [id number]  Take 100 mg by mouth 2 (two) times daily.  Patient not taking: Reported on 04/27/2023   [name], DO  Active Self           Med Note Michaela Corner   Fri Apr 17, 2023 12:33 PM)  04/17/23: reports during [facility] call this was prescribed at time of hospital discharge from [facility] on 04/16/23- reports he is not sure if he is going to take or not- considering; will talk to PCP/ cardiologist before deciding  ezetimibe (ZETIA) 10 MG tablet [id number] Yes Take 10 mg by mouth daily. 04/17/23: reports during [facility] call this was prescribed at time of hospital discharge from [facility] on 04/16/23 [name], DO Taking Active Self           Med  Note Michaela Corner   Fri Apr 17, 2023 12:36 PM) 04/17/23: reports during [facility] call this was prescribed at time of hospital discharge from [facility] on 04/16/23   gabapentin (NEURONTIN) 600 MG tablet [id number]  1 tablet p.o. twice daily for a week then 1 tablet p.o. 3 times daily if needed Monica Becton, MD  Active   ISOSORBIDE PO [id number] Yes Take by mouth. [provider] Taking Active            Med Note Marilu Favre Apr 27, 2023  4:43 PM) 04/27/23: Reported during [facility] call this was started at time of [facility] discharge from [facility] 04/25/23  nitroGLYCERIN (NITROSTAT) 0.4 MG SL tablet [id number]  Place 1 tablet (0.4 mg total) under the tongue every 5 (five) minutes as needed for chest pain. [name], DO  Active   omeprazole (PRILOSEC) 40 MG capsule [id number]  Take 1 capsule (40 mg total) by mouth daily. Jomarie Longs, PA-C  Active   rosuvastatin (CRESTOR) 40 MG tablet [id number] Yes Take 1 tablet (40 mg total) by mouth daily. Monica Becton, MD Taking Active            Med Note Marilu Favre Apr 27, 2023  4:43 PM) 04/27/23: Reported during [facility] call this was started at time of hospital discharge from [facility] 04/25/23   tadalafil (CIALIS) 5 MG tablet [id number]  Take 1 tablet (5 mg total) by mouth daily. Monica Becton, MD  Active   valsartan (DIOVAN) 160 MG tablet [id number]  TAKE 1 TABLET BY MOUTH DAILY Monica Becton, MD  Active            Home Care and Equipment/Supplies: Were Home Health Services Ordered?: No Any new equipment or medical supplies ordered?: No  Functional Questionnaire: Do you need assistance with bathing/showering or dressing?: No Do you need assistance with meal preparation?: No Do you need assistance with eating?: No Do you have difficulty maintaining continence: No Do you need assistance with getting out of bed/getting out of a chair/moving?: No Do you have difficulty managing or taking your  medications?: No  Follow up appointments reviewed: PCP Follow-up appointment confirmed?: Yes Date of PCP follow-up appointment?: 04/27/23 (confirmed attended as scheduled) Follow-up Provider: PCP Specialist Hospital Follow-up appointment confirmed?: Yes Date of Specialist follow-up appointment?: 05/20/23 Follow-Up Specialty Provider:: [facility] cardiology provider Do you need transportation to your follow-up appointment?: No Do you understand care options if your condition(s) worsen?: Yes-patient verbalized understanding  SDOH Interventions Today    Flowsheet Row Most Recent Value  SDOH Interventions   Food Insecurity Interventions Intervention Not Indicated  Transportation Interventions Intervention Not Indicated  [continues to drive self]      TOC Interventions Today    Flowsheet Row Most Recent Value  TOC Interventions   TOC Interventions Discussed/Reviewed TOC Interventions Discussed  [Patient declines need for ongoing/ further care coordination outreach,  no care coordination  needs identified at time of TOC call today]      Interventions Today    Flowsheet Row Most Recent Value  Chronic Disease   Chronic disease during today's visit Other  [CAD with PCI]  General Interventions   General Interventions Discussed/Reviewed General Interventions Discussed, Doctor Visits, Durable Medical Equipment (DME)  Doctor Visits Discussed/Reviewed Doctor Visits Discussed, PCP, Specialist, Doctor Visits Reviewed  [reviewed PCP HFU OV earlier this afternoon- patient verbalizes good understanding of same]  Durable Medical Equipment (DME) Other  [confirmed not currently requiring/ using assistive devices]  PCP/Specialist Visits Compliance with follow-up visit  Education Interventions   Education Provided Provided Education  [purpose of/ importance of having updated DPR completed]  Nutrition Interventions   Nutrition Discussed/Reviewed Nutrition Discussed  Pharmacy Interventions    Pharmacy Dicussed/Reviewed Pharmacy Topics Discussed      Caryl Pina, RN, BSN, CCRN Alumnus RN CM Care Coordination/ Transition of Care- [facility] [phone]: direct office

## 2023-05-20 DIAGNOSIS — I25709 Atherosclerosis of coronary artery bypass graft(s), unspecified, with unspecified angina pectoris: Secondary | ICD-10-CM | POA: Diagnosis not present

## 2023-05-20 DIAGNOSIS — I498 Other specified cardiac arrhythmias: Secondary | ICD-10-CM | POA: Diagnosis not present

## 2023-05-20 DIAGNOSIS — E785 Hyperlipidemia, unspecified: Secondary | ICD-10-CM | POA: Diagnosis not present

## 2023-05-20 DIAGNOSIS — I10 Essential (primary) hypertension: Secondary | ICD-10-CM | POA: Diagnosis not present

## 2023-05-20 DIAGNOSIS — E7849 Other hyperlipidemia: Secondary | ICD-10-CM | POA: Diagnosis not present

## 2023-05-20 DIAGNOSIS — E7841 Elevated Lipoprotein(a): Secondary | ICD-10-CM | POA: Diagnosis not present

## 2023-05-20 DIAGNOSIS — R002 Palpitations: Secondary | ICD-10-CM | POA: Diagnosis not present

## 2023-05-20 DIAGNOSIS — R9431 Abnormal electrocardiogram [ECG] [EKG]: Secondary | ICD-10-CM | POA: Diagnosis not present

## 2023-06-09 DIAGNOSIS — Z955 Presence of coronary angioplasty implant and graft: Secondary | ICD-10-CM | POA: Diagnosis not present

## 2023-06-09 DIAGNOSIS — Z95820 Peripheral vascular angioplasty status with implants and grafts: Secondary | ICD-10-CM | POA: Diagnosis not present

## 2023-06-10 DIAGNOSIS — Z955 Presence of coronary angioplasty implant and graft: Secondary | ICD-10-CM | POA: Diagnosis not present

## 2023-06-10 DIAGNOSIS — Z95820 Peripheral vascular angioplasty status with implants and grafts: Secondary | ICD-10-CM | POA: Diagnosis not present

## 2023-06-11 DIAGNOSIS — Z95820 Peripheral vascular angioplasty status with implants and grafts: Secondary | ICD-10-CM | POA: Diagnosis not present

## 2023-06-11 DIAGNOSIS — Z955 Presence of coronary angioplasty implant and graft: Secondary | ICD-10-CM | POA: Diagnosis not present

## 2023-06-15 DIAGNOSIS — Z955 Presence of coronary angioplasty implant and graft: Secondary | ICD-10-CM | POA: Diagnosis not present

## 2023-06-15 DIAGNOSIS — Z95820 Peripheral vascular angioplasty status with implants and grafts: Secondary | ICD-10-CM | POA: Diagnosis not present

## 2023-06-17 DIAGNOSIS — Z955 Presence of coronary angioplasty implant and graft: Secondary | ICD-10-CM | POA: Diagnosis not present

## 2023-06-17 DIAGNOSIS — Z95820 Peripheral vascular angioplasty status with implants and grafts: Secondary | ICD-10-CM | POA: Diagnosis not present

## 2023-06-18 DIAGNOSIS — Z955 Presence of coronary angioplasty implant and graft: Secondary | ICD-10-CM | POA: Diagnosis not present

## 2023-06-18 DIAGNOSIS — Z95820 Peripheral vascular angioplasty status with implants and grafts: Secondary | ICD-10-CM | POA: Diagnosis not present

## 2023-06-22 DIAGNOSIS — Z955 Presence of coronary angioplasty implant and graft: Secondary | ICD-10-CM | POA: Diagnosis not present

## 2023-06-22 DIAGNOSIS — Z95820 Peripheral vascular angioplasty status with implants and grafts: Secondary | ICD-10-CM | POA: Diagnosis not present

## 2023-06-24 DIAGNOSIS — Z95820 Peripheral vascular angioplasty status with implants and grafts: Secondary | ICD-10-CM | POA: Diagnosis not present

## 2023-06-24 DIAGNOSIS — Z955 Presence of coronary angioplasty implant and graft: Secondary | ICD-10-CM | POA: Diagnosis not present

## 2023-06-25 DIAGNOSIS — Z955 Presence of coronary angioplasty implant and graft: Secondary | ICD-10-CM | POA: Diagnosis not present

## 2023-06-25 DIAGNOSIS — Z95820 Peripheral vascular angioplasty status with implants and grafts: Secondary | ICD-10-CM | POA: Diagnosis not present

## 2023-06-29 DIAGNOSIS — Z955 Presence of coronary angioplasty implant and graft: Secondary | ICD-10-CM | POA: Diagnosis not present

## 2023-06-29 DIAGNOSIS — Z95820 Peripheral vascular angioplasty status with implants and grafts: Secondary | ICD-10-CM | POA: Diagnosis not present

## 2023-07-01 DIAGNOSIS — Z95820 Peripheral vascular angioplasty status with implants and grafts: Secondary | ICD-10-CM | POA: Diagnosis not present

## 2023-07-01 DIAGNOSIS — Z955 Presence of coronary angioplasty implant and graft: Secondary | ICD-10-CM | POA: Diagnosis not present

## 2023-07-06 DIAGNOSIS — Z955 Presence of coronary angioplasty implant and graft: Secondary | ICD-10-CM | POA: Diagnosis not present

## 2023-07-06 DIAGNOSIS — Z95820 Peripheral vascular angioplasty status with implants and grafts: Secondary | ICD-10-CM | POA: Diagnosis not present

## 2023-07-08 DIAGNOSIS — Z955 Presence of coronary angioplasty implant and graft: Secondary | ICD-10-CM | POA: Diagnosis not present

## 2023-07-08 DIAGNOSIS — Z95820 Peripheral vascular angioplasty status with implants and grafts: Secondary | ICD-10-CM | POA: Diagnosis not present

## 2023-07-09 DIAGNOSIS — Z95820 Peripheral vascular angioplasty status with implants and grafts: Secondary | ICD-10-CM | POA: Diagnosis not present

## 2023-07-09 DIAGNOSIS — Z955 Presence of coronary angioplasty implant and graft: Secondary | ICD-10-CM | POA: Diagnosis not present

## 2023-07-13 DIAGNOSIS — Z955 Presence of coronary angioplasty implant and graft: Secondary | ICD-10-CM | POA: Diagnosis not present

## 2023-07-13 DIAGNOSIS — Z95820 Peripheral vascular angioplasty status with implants and grafts: Secondary | ICD-10-CM | POA: Diagnosis not present

## 2023-07-15 ENCOUNTER — Encounter: Payer: Self-pay | Admitting: Family Medicine

## 2023-07-15 ENCOUNTER — Ambulatory Visit (INDEPENDENT_AMBULATORY_CARE_PROVIDER_SITE_OTHER): Payer: Medicare HMO | Admitting: Family Medicine

## 2023-07-15 VITALS — BP 113/70 | HR 62 | Ht 72.0 in | Wt 205.8 lb

## 2023-07-15 DIAGNOSIS — I10 Essential (primary) hypertension: Secondary | ICD-10-CM

## 2023-07-15 DIAGNOSIS — F5101 Primary insomnia: Secondary | ICD-10-CM

## 2023-07-15 DIAGNOSIS — Z95820 Peripheral vascular angioplasty status with implants and grafts: Secondary | ICD-10-CM | POA: Diagnosis not present

## 2023-07-15 DIAGNOSIS — Z955 Presence of coronary angioplasty implant and graft: Secondary | ICD-10-CM | POA: Diagnosis not present

## 2023-07-15 NOTE — Assessment & Plan Note (Signed)
Discussed pt trying melatonin first. I am hesitant to jump right to a prescription drug due to his recent cardiac history and him currently being in cardiac rehab.  - if melatonin 5mg  does not work, we can try ramelteon as according to up to date is have safe side effect profile with no mention of cardiac concerns. I would avoid trazodone as it can cause cardiac concerns

## 2023-07-15 NOTE — Patient Instructions (Addendum)
Try melatonin 5mg  and let me know how it helps sleep

## 2023-07-15 NOTE — Assessment & Plan Note (Signed)
Pt doing well from HTN standpoint, repeat bp is normal. He is down to valsartan 40mg  daily which seems to be working well for him.

## 2023-07-15 NOTE — Progress Notes (Signed)
Established patient visit   Patient: Jerome Chase   DOB: 1951/08/25   72 y.o. Male  MRN: [id number] Visit Date: 07/15/2023  Today's healthcare provider: [name], DO   Chief Complaint  Patient presents with   Medical Management of Chronic Issues    HTN    SUBJECTIVE    Chief Complaint  Patient presents with   Medical Management of Chronic Issues    HTN   HPI HPI     Medical Management of Chronic Issues    Additional comments: HTN      Last edited by Roselyn Reef, CMA on 07/15/2023 10:55 AM.      Pt presents for follow up on HTN. Pt is currently on valsartan 40mg  and carvedilol 3.125mg . since our last visit patient has undergone heart attack with stent placement. Had recent blood work done during his hospitalization in August 2024 that was normal.   Doing well today with htn.   Has had some insomnia issues lately. He notes, for example last night he did not go to sleep until 3am and then was up by 5am.   Review of Systems  Constitutional:  Negative for activity change, fatigue and fever.  Respiratory:  Negative for cough and shortness of breath.   Cardiovascular:  Negative for chest pain.  Gastrointestinal:  Negative for abdominal pain.  Genitourinary:  Negative for difficulty urinating.       Current Meds  Medication Sig   aspirin EC 81 MG tablet Take 81 mg by mouth daily.   carvedilol (COREG) 3.125 MG tablet Take 3.125 mg by mouth 2 (two) times daily with a meal. 04/17/23: reports during [facility] call this was prescribed at time of hospital discharge from [facility] on 04/16/23   clopidogrel (PLAVIX) 75 MG tablet Take 75 mg by mouth daily. 04/17/23: reports during [facility] call this was prescribed at time of hospital discharge from [facility] on 04/16/23   ezetimibe (ZETIA) 10 MG tablet Take 10 mg by mouth daily. 04/17/23: reports during [facility] call this was prescribed at time of hospital discharge from [facility] on 04/16/23   gabapentin (NEURONTIN) 600 MG  tablet 1 tablet p.o. twice daily for a week then 1 tablet p.o. 3 times daily if needed   ISOSORBIDE PO Take by mouth.   nitroGLYCERIN (NITROSTAT) 0.4 MG SL tablet Place 1 tablet (0.4 mg total) under the tongue every 5 (five) minutes as needed for chest pain.   omeprazole (PRILOSEC) 40 MG capsule Take 1 capsule (40 mg total) by mouth daily.   rosuvastatin (CRESTOR) 40 MG tablet Take 1 tablet (40 mg total) by mouth daily.   tadalafil (CIALIS) 5 MG tablet Take 1 tablet (5 mg total) by mouth daily.   valsartan (DIOVAN) 40 MG tablet Take 40 mg by mouth daily.   [DISCONTINUED] valsartan (DIOVAN) 160 MG tablet TAKE 1 TABLET BY MOUTH DAILY    OBJECTIVE    BP 113/70 (BP Location: Left Arm, Patient Position: Sitting, Cuff Size: Large)   Pulse 62   Ht 6' (1.829 m)   Wt 205 lb 12 oz (93.3 kg)   SpO2 100%   BMI 27.90 kg/m   Physical Exam Vitals and nursing note reviewed.  Constitutional:      General: He is not in acute distress.    Appearance: Normal appearance.  HENT:     Head: Normocephalic and atraumatic.     Right Ear: External ear normal.     Left Ear: External ear  normal.     Nose: Nose normal.  Eyes:     Conjunctiva/sclera: Conjunctivae normal.  Cardiovascular:     Rate and Rhythm: Normal rate and regular rhythm.  Pulmonary:     Effort: Pulmonary effort is normal.     Breath sounds: Normal breath sounds.  Neurological:     General: No focal deficit present.     Mental Status: He is alert and oriented to person, place, and time.  Psychiatric:        Mood and Affect: Mood normal.        Behavior: Behavior normal.        Thought Content: Thought content normal.        Judgment: Judgment normal.        ASSESSMENT & PLAN    Problem List Items Addressed This Visit       Cardiovascular and Mediastinum   Essential hypertension - Primary    Pt doing well from HTN standpoint, repeat bp is normal. He is down to valsartan 40mg  daily which seems to be working well for him.       Relevant Medications   valsartan (DIOVAN) 40 MG tablet     Other   Insomnia    Discussed pt trying melatonin first. I am hesitant to jump right to a prescription drug due to his recent cardiac history and him currently being in cardiac rehab.  - if melatonin 5mg  does not work, we can try ramelteon as according to up to date is have safe side effect profile with no mention of cardiac concerns. I would avoid trazodone as it can cause cardiac concerns        Return in about 3 months (around 10/15/2023) for HTN follow up.      No orders of the defined types were placed in this encounter.   No orders of the defined types were placed in this encounter.    [name], DO  [facility] Medicine at [facility] [phone] (phone) [phone] (fax)  [facility]

## 2023-07-16 DIAGNOSIS — Z95820 Peripheral vascular angioplasty status with implants and grafts: Secondary | ICD-10-CM | POA: Diagnosis not present

## 2023-07-16 DIAGNOSIS — Z955 Presence of coronary angioplasty implant and graft: Secondary | ICD-10-CM | POA: Diagnosis not present

## 2023-07-22 DIAGNOSIS — Z95820 Peripheral vascular angioplasty status with implants and grafts: Secondary | ICD-10-CM | POA: Diagnosis not present

## 2023-07-22 DIAGNOSIS — Z955 Presence of coronary angioplasty implant and graft: Secondary | ICD-10-CM | POA: Diagnosis not present

## 2023-07-23 DIAGNOSIS — Z955 Presence of coronary angioplasty implant and graft: Secondary | ICD-10-CM | POA: Diagnosis not present

## 2023-07-23 DIAGNOSIS — Z95820 Peripheral vascular angioplasty status with implants and grafts: Secondary | ICD-10-CM | POA: Diagnosis not present

## 2023-07-27 DIAGNOSIS — Z955 Presence of coronary angioplasty implant and graft: Secondary | ICD-10-CM | POA: Diagnosis not present

## 2023-07-27 DIAGNOSIS — Z95820 Peripheral vascular angioplasty status with implants and grafts: Secondary | ICD-10-CM | POA: Diagnosis not present

## 2023-07-29 DIAGNOSIS — Z955 Presence of coronary angioplasty implant and graft: Secondary | ICD-10-CM | POA: Diagnosis not present

## 2023-07-29 DIAGNOSIS — Z95820 Peripheral vascular angioplasty status with implants and grafts: Secondary | ICD-10-CM | POA: Diagnosis not present

## 2023-07-30 DIAGNOSIS — Z95820 Peripheral vascular angioplasty status with implants and grafts: Secondary | ICD-10-CM | POA: Diagnosis not present

## 2023-07-30 DIAGNOSIS — Z955 Presence of coronary angioplasty implant and graft: Secondary | ICD-10-CM | POA: Diagnosis not present

## 2023-08-03 DIAGNOSIS — Z955 Presence of coronary angioplasty implant and graft: Secondary | ICD-10-CM | POA: Diagnosis not present

## 2023-08-03 DIAGNOSIS — Z95820 Peripheral vascular angioplasty status with implants and grafts: Secondary | ICD-10-CM | POA: Diagnosis not present

## 2023-08-10 DIAGNOSIS — Z955 Presence of coronary angioplasty implant and graft: Secondary | ICD-10-CM | POA: Diagnosis not present

## 2023-08-10 DIAGNOSIS — Z95820 Peripheral vascular angioplasty status with implants and grafts: Secondary | ICD-10-CM | POA: Diagnosis not present

## 2023-08-12 DIAGNOSIS — Z95820 Peripheral vascular angioplasty status with implants and grafts: Secondary | ICD-10-CM | POA: Diagnosis not present

## 2023-08-12 DIAGNOSIS — Z955 Presence of coronary angioplasty implant and graft: Secondary | ICD-10-CM | POA: Diagnosis not present

## 2023-08-13 DIAGNOSIS — Z95820 Peripheral vascular angioplasty status with implants and grafts: Secondary | ICD-10-CM | POA: Diagnosis not present

## 2023-08-13 DIAGNOSIS — Z955 Presence of coronary angioplasty implant and graft: Secondary | ICD-10-CM | POA: Diagnosis not present

## 2023-08-17 DIAGNOSIS — Z95820 Peripheral vascular angioplasty status with implants and grafts: Secondary | ICD-10-CM | POA: Diagnosis not present

## 2023-08-17 DIAGNOSIS — Z955 Presence of coronary angioplasty implant and graft: Secondary | ICD-10-CM | POA: Diagnosis not present

## 2023-08-19 DIAGNOSIS — Z95820 Peripheral vascular angioplasty status with implants and grafts: Secondary | ICD-10-CM | POA: Diagnosis not present

## 2023-08-19 DIAGNOSIS — Z955 Presence of coronary angioplasty implant and graft: Secondary | ICD-10-CM | POA: Diagnosis not present

## 2023-08-24 DIAGNOSIS — Z95820 Peripheral vascular angioplasty status with implants and grafts: Secondary | ICD-10-CM | POA: Diagnosis not present

## 2023-08-24 DIAGNOSIS — Z955 Presence of coronary angioplasty implant and graft: Secondary | ICD-10-CM | POA: Diagnosis not present

## 2023-08-26 DIAGNOSIS — Z95820 Peripheral vascular angioplasty status with implants and grafts: Secondary | ICD-10-CM | POA: Diagnosis not present

## 2023-08-26 DIAGNOSIS — Z955 Presence of coronary angioplasty implant and graft: Secondary | ICD-10-CM | POA: Diagnosis not present

## 2023-08-27 DIAGNOSIS — Z955 Presence of coronary angioplasty implant and graft: Secondary | ICD-10-CM | POA: Diagnosis not present

## 2023-08-27 DIAGNOSIS — Z95820 Peripheral vascular angioplasty status with implants and grafts: Secondary | ICD-10-CM | POA: Diagnosis not present

## 2023-08-31 DIAGNOSIS — Z955 Presence of coronary angioplasty implant and graft: Secondary | ICD-10-CM | POA: Diagnosis not present

## 2023-08-31 DIAGNOSIS — Z95820 Peripheral vascular angioplasty status with implants and grafts: Secondary | ICD-10-CM | POA: Diagnosis not present

## 2023-09-14 DIAGNOSIS — Z95820 Peripheral vascular angioplasty status with implants and grafts: Secondary | ICD-10-CM | POA: Diagnosis not present

## 2023-09-14 DIAGNOSIS — Z955 Presence of coronary angioplasty implant and graft: Secondary | ICD-10-CM | POA: Diagnosis not present

## 2023-09-16 DIAGNOSIS — Z95820 Peripheral vascular angioplasty status with implants and grafts: Secondary | ICD-10-CM | POA: Diagnosis not present

## 2023-09-16 DIAGNOSIS — Z955 Presence of coronary angioplasty implant and graft: Secondary | ICD-10-CM | POA: Diagnosis not present

## 2023-09-17 DIAGNOSIS — Z955 Presence of coronary angioplasty implant and graft: Secondary | ICD-10-CM | POA: Diagnosis not present

## 2023-09-17 DIAGNOSIS — Z95820 Peripheral vascular angioplasty status with implants and grafts: Secondary | ICD-10-CM | POA: Diagnosis not present

## 2023-10-15 ENCOUNTER — Ambulatory Visit: Payer: Medicare HMO | Admitting: Family Medicine

## 2023-10-20 ENCOUNTER — Ambulatory Visit (INDEPENDENT_AMBULATORY_CARE_PROVIDER_SITE_OTHER): Payer: Medicare HMO | Admitting: Family Medicine

## 2023-10-20 ENCOUNTER — Encounter: Payer: Self-pay | Admitting: Family Medicine

## 2023-10-20 VITALS — BP 104/63 | HR 58 | Ht 72.0 in | Wt 204.0 lb

## 2023-10-20 DIAGNOSIS — I10 Essential (primary) hypertension: Secondary | ICD-10-CM | POA: Diagnosis not present

## 2023-10-20 DIAGNOSIS — N529 Male erectile dysfunction, unspecified: Secondary | ICD-10-CM

## 2023-10-20 NOTE — Assessment & Plan Note (Signed)
Pt bp slighlty low however he does a good job managing his blood pressure so we will let him manage and it sounds like he doesn't use the valsartan which I would prefer for him to do  - follow up 6 months  - will get blood work

## 2023-10-20 NOTE — Progress Notes (Signed)
Established patient visit   Patient: Jerome Chase   DOB: [birth year]   73 y.o. Male  MRN: [id number] Visit Date: 10/20/2023  Today's healthcare provider: [name], DO   Chief Complaint  Patient presents with   Medical Management of Chronic Issues    HTN    SUBJECTIVE    Chief Complaint  Patient presents with   Medical Management of Chronic Issues    HTN   HPI HPI     Medical Management of Chronic Issues    Additional comments: HTN      Last edited by Roselyn Reef, CMA on 10/20/2023  2:55 PM.      Pt presents for HTN follow up. Currently on valsartan 40mg  but he has not taken it today due to his hypotension. He monitors his blood pressure regularly and will take medication as needed based on his blood pressure. He is currently undergoing cardiac rehab.   Review of Systems  Constitutional:  Negative for activity change, fatigue and fever.  Respiratory:  Negative for cough and shortness of breath.   Cardiovascular:  Negative for chest pain.  Gastrointestinal:  Negative for abdominal pain.  Genitourinary:  Negative for difficulty urinating.       No outpatient medications have been marked as taking for the 10/20/23 encounter (Office Visit) with [name], DO.    OBJECTIVE    BP 104/63 (BP Location: Left Arm, Patient Position: Sitting, Cuff Size: Large)   Pulse (!) 58   Ht 6' (1.829 m)   Wt 204 lb (92.5 kg)   SpO2 99%   BMI 27.67 kg/m   Physical Exam Vitals and nursing note reviewed.  Constitutional:      General: He is not in acute distress.    Appearance: Normal appearance.  HENT:     Head: Normocephalic and atraumatic.     Right Ear: External ear normal.     Left Ear: External ear normal.     Nose: Nose normal.  Eyes:     Conjunctiva/sclera: Conjunctivae normal.  Cardiovascular:     Rate and Rhythm: Normal rate and regular rhythm.  Pulmonary:     Effort: Pulmonary effort is normal.     Breath sounds: Normal breath sounds.   Neurological:     General: No focal deficit present.     Mental Status: He is alert and oriented to person, place, and time.  Psychiatric:        Mood and Affect: Mood normal.        Behavior: Behavior normal.        Thought Content: Thought content normal.        Judgment: Judgment normal.        ASSESSMENT & PLAN    Problem List Items Addressed This Visit       Cardiovascular and Mediastinum   Essential hypertension - Primary   Pt bp slighlty low however he does a good job managing his blood pressure so we will let him manage and it sounds like he doesn't use the valsartan which I would prefer for him to do  - follow up 6 months  - will get blood work        Other   Erectile dysfunction   Recommended he discuss with urology about cialis given recent heart attack hx        No follow-ups on file.      No orders of the defined types were placed in this encounter.  No orders of the defined types were placed in this encounter.    [name], DO  [facility] at [facility] [phone] (phone) [phone] (fax)  [facility]

## 2023-10-20 NOTE — Assessment & Plan Note (Signed)
Recommended he discuss with urology about cialis given recent heart attack hx

## 2023-12-16 ENCOUNTER — Other Ambulatory Visit: Payer: Self-pay | Admitting: Sports Medicine

## 2023-12-16 DIAGNOSIS — I10 Essential (primary) hypertension: Secondary | ICD-10-CM | POA: Diagnosis not present

## 2023-12-16 DIAGNOSIS — N529 Male erectile dysfunction, unspecified: Secondary | ICD-10-CM

## 2023-12-16 DIAGNOSIS — E785 Hyperlipidemia, unspecified: Secondary | ICD-10-CM | POA: Diagnosis not present

## 2023-12-16 DIAGNOSIS — I25709 Atherosclerosis of coronary artery bypass graft(s), unspecified, with unspecified angina pectoris: Secondary | ICD-10-CM | POA: Diagnosis not present

## 2023-12-16 DIAGNOSIS — Z95820 Peripheral vascular angioplasty status with implants and grafts: Secondary | ICD-10-CM | POA: Diagnosis not present

## 2024-01-06 DIAGNOSIS — N5201 Erectile dysfunction due to arterial insufficiency: Secondary | ICD-10-CM | POA: Diagnosis not present

## 2024-01-14 ENCOUNTER — Encounter: Payer: Self-pay | Admitting: Family Medicine

## 2024-01-27 ENCOUNTER — Ambulatory Visit (INDEPENDENT_AMBULATORY_CARE_PROVIDER_SITE_OTHER): Payer: Medicare HMO

## 2024-01-27 ENCOUNTER — Other Ambulatory Visit: Payer: Self-pay

## 2024-01-27 VITALS — Ht 72.0 in | Wt 193.0 lb

## 2024-01-27 DIAGNOSIS — Z0000 Encounter for general adult medical examination without abnormal findings: Secondary | ICD-10-CM | POA: Diagnosis not present

## 2024-01-27 DIAGNOSIS — N529 Male erectile dysfunction, unspecified: Secondary | ICD-10-CM

## 2024-01-27 NOTE — Telephone Encounter (Signed)
 Unsure if medication refill is appropriate due to past OV noted:  Erectile dysfunction     Recommended he discuss with urology about cialis  given recent heart attack hx

## 2024-01-27 NOTE — Telephone Encounter (Signed)
 Pt saw urology 4/25 defer to urology.

## 2024-01-27 NOTE — Patient Instructions (Signed)
  Mr. Manes , Thank you for taking time to come for your Medicare Wellness Visit. I appreciate your ongoing commitment to your health goals. Please review the following plan we discussed and let me know if I can assist you in the future.   These are the goals we discussed:  Goals       Patient Stated (pt-stated)      Patient stated that he would like to loose some weight.      Patient Stated      Patient states he would like to continue a healthy lifestyle.         This is a list of the screening recommended for you and due dates:  Health Maintenance  Topic Date Due   Zoster (Shingles) Vaccine (1 of 2) Never done   Colon Cancer Screening  Never done   COVID-19 Vaccine (2 - Janssen risk series) 10/08/2020   Flu Shot  04/08/2024   Medicare Annual Wellness Visit  01/26/2025   Pneumonia Vaccine  Completed   Hepatitis C Screening  Completed   HPV Vaccine  Aged Out   Meningitis B Vaccine  Aged Out   DTaP/Tdap/Td vaccine  Discontinued

## 2024-01-27 NOTE — Progress Notes (Signed)
 Subjective:   NOLON YELLIN is a 73 y.o. male who presents for Medicare Annual/Subsequent preventive examination.  Visit Complete: Virtual I connected with  Glendon Landsberg on 01/27/24 by a audio enabled telemedicine application and verified that I am speaking with the correct person using two identifiers.  Patient Location: Home  Provider Location: Office/Clinic  I discussed the limitations of evaluation and management by telemedicine. The patient expressed understanding and agreed to proceed.  Vital Signs: Because this visit was a virtual/telehealth visit, some criteria may be missing or patient reported. Any vitals not documented were not able to be obtained and vitals that have been documented are patient reported.  Patient Medicare AWV questionnaire was completed by the patient on n/a; I have confirmed that all information answered by patient is correct and no changes since this date.  Cardiac Risk Factors include: male gender;hypertension;smoking/ tobacco exposure;dyslipidemia;advanced age (>61men, >73 women);family history of premature cardiovascular disease     Objective:    Today's Vitals   01/27/24 0805  Weight: 193 lb (87.5 kg)  Height: 6' (1.829 m)   Body mass index is 26.18 kg/m.     01/27/2024    8:20 AM 01/21/2023    8:26 AM 10/02/2021    9:32 AM 11/05/2017   10:22 AM  Advanced Directives  Does Patient Have a Medical Advance Directive? No No No No  Would patient like information on creating a medical advance directive? No - Patient declined No - Patient declined No - Patient declined No - Patient declined    Current Medications (verified) Outpatient Encounter Medications as of 01/27/2024  Medication Sig   amLODipine (NORVASC) 10 MG tablet Take 1 tablet by mouth daily.   aspirin EC 81 MG tablet Take 81 mg by mouth daily.   clopidogrel  (PLAVIX ) 75 MG tablet Take 75 mg by mouth daily. 04/17/23: reports during [facility] call this was prescribed at time of hospital  discharge from [facility] on 04/16/23   ezetimibe (ZETIA) 10 MG tablet Take 10 mg by mouth daily. 04/17/23: reports during [facility] call this was prescribed at time of hospital discharge from [facility] on 04/16/23   gabapentin  (NEURONTIN ) 600 MG tablet 1 tablet p.o. twice daily for a week then 1 tablet p.o. 3 times daily if needed   nitroGLYCERIN  (NITROSTAT ) 0.4 MG SL tablet Place 1 tablet (0.4 mg total) under the tongue every 5 (five) minutes as needed for chest pain.   omeprazole  (PRILOSEC) 40 MG capsule Take 1 capsule (40 mg total) by mouth daily.   rosuvastatin  (CRESTOR ) 40 MG tablet Take 1 tablet (40 mg total) by mouth daily.   telmisartan (MICARDIS) 20 MG tablet Take 1 tablet by mouth daily.   tadalafil  (CIALIS ) 5 MG tablet Take 1 tablet (5 mg total) by mouth daily. (Patient not taking: Reported on 01/27/2024)   [DISCONTINUED] carvedilol (COREG) 3.125 MG tablet Take 3.125 mg by mouth 2 (two) times daily with a meal. 04/17/23: reports during [facility] call this was prescribed at time of hospital discharge from [facility] on 04/16/23   [DISCONTINUED] ISOSORBIDE PO Take by mouth.   [DISCONTINUED] valsartan  (DIOVAN ) 40 MG tablet Take 40 mg by mouth daily.   No facility-administered encounter medications on file as of 01/27/2024.    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/20/2017   Dyslipidemia 05/28/2017   Essential hypertension 07/06/2015   Heart attack (HCC)    Hypertension    Status post coronary artery stent placement 05/28/2017   Past Surgical History:  Procedure Laterality Date   CARDIAC SURGERY     Family History  Problem Relation Age of Onset   Congestive Heart Failure  Mother    Heart attack Mother    Cancer Father        Lung and stomach CA   Diabetes Brother        2 brothers   Heart attack Brother    Social History   Socioeconomic History   Marital status: Widowed    Spouse name: Almer Bushey   Number of children: 3   Years of education: Not on file   Highest education level: Some college, no degree  Occupational History   Occupation: Sport and exercise psychologist: [employer]     Comment: Full-time  Tobacco Use   Smoking status: Former    Current packs/day: 0.00    Average packs/day: 1 pack/day for 39.4 years (39.4 ttl pk-yrs)    Types: Cigarettes    Start date: 02/14/1966    Quit date: 07/05/2005    Years since quitting: 18.5   Smokeless tobacco: Never  Vaping Use   Vaping status: Never Used  Substance and Sexual Activity   Alcohol use: Not Currently    Comment: occasional beer with a meal   Drug use: No   Sexual activity: Not Currently    Partners: Female    Birth control/protection: None  Other Topics Concern   Not on file  Social History Narrative   Lives with two grandchildren. He has three children. He is still working full time. He enjoys travelling.   Social Drivers of Corporate investment banker Strain: Low Risk  (01/27/2024)   Overall Financial Resource Strain (CARDIA)    Difficulty of Paying Living Expenses: Not hard at all  Food Insecurity: No Food Insecurity (01/27/2024)   Hunger Vital Sign    Worried About Running Out of Food in the Last Year: Never true    Ran Out of Food in the Last Year: Never true  Transportation Needs: No Transportation Needs (01/27/2024)   PRAPARE - Administrator, Civil Service (Medical): No    Lack of Transportation (Non-Medical): No  Physical Activity: Insufficiently Active (01/27/2024)   Exercise Vital Sign    Days of Exercise per Week: 3 days    Minutes of Exercise per Session: 40 min  Stress: No Stress Concern Present (01/27/2024)   [employer] of Occupational  Health - Occupational Stress Questionnaire    Feeling of Stress : Only a little  Social Connections: Moderately Integrated (01/27/2024)   Social Connection and Isolation Panel [NHANES]    Frequency of Communication with Friends and Family: More than three times a week    Frequency of Social Gatherings with Friends and Family: More than three times a week    Attends Religious Services: More than 4 times per year    Active Member of [employer] or Organizations: Yes    Attends Banker Meetings: More than 4 times per year    Marital Status: Widowed    Tobacco Counseling Counseling given: Not Answered   Clinical Intake:  Pre-visit preparation completed: Yes  Pain : No/denies pain     BMI - recorded: 26.18 Nutritional Status: BMI 25 -29 Overweight  Nutritional Risks: None Diabetes: No  How often do you need to have someone help you when you read instructions, pamphlets, or other written materials from your doctor or pharmacy?: 1 - Never What is the last grade level you completed in school?: 14  Interpreter Needed?: No      Activities of Daily Living    01/27/2024    8:07 AM  In your present state of health, do you have any difficulty performing the following activities:  Hearing? 0  Vision? 0  Difficulty concentrating or making decisions? 0  Walking or climbing stairs? 0  Dressing or bathing? 0  Preparing Food and eating ? N  Using the Toilet? N  In the past six months, have you accidently leaked urine? N  Do you have problems with loss of bowel control? N  Managing your Medications? N  Managing your Finances? N  Housekeeping or managing your Housekeeping? N    Patient Care Team: Josepha Nickels, DO as PCP - General (Family Medicine) Gean Keels, MD as Consulting Physician (Sports Medicine)  Indicate any recent Medical Services you may have received from other than [facility] providers in the past year (date may be approximate).     Assessment:    This is a routine wellness examination for Jerome Chase.  Hearing/Vision screen No results found.   Goals Addressed             This Visit's Progress    Patient Stated       Patient states he would like to continue a healthy lifestyle.       Depression Screen    01/27/2024    8:19 AM 04/27/2023    2:48 PM 04/20/2023    3:04 PM 02/10/2023    1:36 PM 01/21/2023    8:27 AM 07/29/2022    4:57 PM 10/28/2017   10:31 AM  PHQ 2/9 Scores  PHQ - 2 Score 0 0 0 0 0 0 2  PHQ- 9 Score  0 2    11    Fall Risk    01/27/2024    8:21 AM 04/20/2023    3:04 PM 02/10/2023    1:36 PM 01/21/2023    8:26 AM 07/29/2022    4:57 PM  Fall Risk   Falls in the past year? 1 0 0 0 0  Number falls in past yr: 0 0 0 0 0  Injury with Fall? 0 0 0 0 0  Risk for fall due to : No Fall Risks No Fall Risks No Fall Risks No Fall Risks No Fall Risks  Follow up Falls evaluation completed Falls evaluation completed Falls evaluation completed Falls evaluation completed Falls evaluation completed    MEDICARE RISK AT HOME: Medicare Risk at Home Any stairs in or around the home?: Yes If so, are there any without handrails?: Yes Home free of loose throw rugs in walkways, pet beds, electrical cords, etc?: Yes Adequate lighting in your home to reduce risk of falls?: Yes Life alert?: No Use of a cane, walker or w/c?: No Grab bars in the bathroom?: Yes Shower chair or bench in shower?: Yes Elevated toilet seat or a handicapped toilet?: No  TIMED UP AND GO:  Was the test performed?  No    Cognitive Function:        01/27/2024    8:23 AM 01/21/2023    8:30 AM  6CIT Screen  What Year? 0 points 0 points  What month? 0 points 0 points  What time?  0 points 0 points  Count back from 20 0 points 0 points  Months in reverse 2 points 0 points  Repeat phrase 0 points 0 points  Total Score 2 points 0 points    Immunizations Immunization History  Administered Date(s) Administered   Influenza-Unspecified 08/08/2017    Janssen (J&J) SARS-COV-2 Vaccination 09/10/2020    TDAP status: Due, Education has been provided regarding the importance of this vaccine. Advised may receive this vaccine at local pharmacy or Health Dept. Aware to provide a copy of the vaccination record if obtained from local pharmacy or Health Dept. Verbalized acceptance and understanding.  Flu Vaccine status: Up to date  Pneumococcal vaccine status: Due, Education has been provided regarding the importance of this vaccine. Advised may receive this vaccine at local pharmacy or Health Dept. Aware to provide a copy of the vaccination record if obtained from local pharmacy or Health Dept. Verbalized acceptance and understanding.  Covid-19 vaccine status: Declined, Education has been provided regarding the importance of this vaccine but patient still declined. Advised may receive this vaccine at local pharmacy or Health Dept.or vaccine clinic. Aware to provide a copy of the vaccination record if obtained from local pharmacy or Health Dept. Verbalized acceptance and understanding.  Qualifies for Shingles Vaccine? Yes   Zostavax completed No   Shingrix Completed?: No.    Education has been provided regarding the importance of this vaccine. Patient has been advised to call insurance company to determine out of pocket expense if they have not yet received this vaccine. Advised may also receive vaccine at local pharmacy or Health Dept. Verbalized acceptance and understanding.  Screening Tests Health Maintenance  Topic Date Due   Pneumonia Vaccine 10+ Years old (1 of 2 - PCV) Never done   Zoster Vaccines- Shingrix (1 of 2) Never done   Colonoscopy  Never done   COVID-19 Vaccine (2 - Janssen risk series) 10/08/2020   INFLUENZA VACCINE  04/08/2024   Medicare Annual Wellness (AWV)  01/26/2025   Hepatitis C Screening  Completed   HPV VACCINES  Aged Out   Meningococcal B Vaccine  Aged Out   DTaP/Tdap/Td  Discontinued    Health Maintenance  Health  Maintenance Due  Topic Date Due   Pneumonia Vaccine 90+ Years old (1 of 2 - PCV) Never done   Zoster Vaccines- Shingrix (1 of 2) Never done   Colonoscopy  Never done   COVID-19 Vaccine (2 - Janssen risk series) 10/08/2020    Colorectal cancer screening: No longer required. Per patient.   Lung Cancer Screening: (Low Dose CT Chest recommended if Age 61-80 years, 20 pack-year currently smoking OR have quit w/in 15years.) does not qualify.   Lung Cancer Screening Referral: N/a  Additional Screening:  Hepatitis C Screening: does qualify; Completed 05/07/2017  Vision Screening: Recommended annual ophthalmology exams for early detection of glaucoma and other disorders of the eye. Is the patient up to date with their annual eye exam?  Yes  Who is the provider or what is the name of the office in which the patient attends annual eye exams? Patient doesn't remember If pt is not established with a provider, would they like to be referred to a provider to establish care?  .   Dental Screening: Recommended annual dental exams for proper oral hygiene   Community Resource Referral / Chronic Care Management: CRR required this visit?  No   CCM required this visit?  No     Plan:     I have  personally reviewed and noted the following in the patient's chart:   Medical and social history Use of alcohol, tobacco or illicit drugs  Current medications and supplements including opioid prescriptions. Patient is not currently taking opioid prescriptions. Functional ability and status Nutritional status Physical activity Advanced directives List of other physicians Hospitalizations # 1, surgeries # 1, and ER # 1 visits in previous 12 months Vitals Screenings to include cognitive, depression, and falls Referrals and appointments  In addition, I have reviewed and discussed with patient certain preventive protocols, quality metrics, and best practice recommendations. A written personalized care  plan for preventive services as well as general preventive health recommendations were provided to patient.     Aubrey Leaf, CMA   01/27/2024   After Visit Summary: (MyChart) Due to this being a telephonic visit, the after visit summary with patients personalized plan was offered to patient via MyChart   Nurse Notes:   ZAILYN ROWSER is a 73 y.o. male patient of Dianah Fort, Erika S, DO who had a Medicare Annual Wellness Visit today via telephone. Kyair lives with their family. He has 3 children. He reports that he is socially active and does interact with friends/family regularly. He is moderately physically active and enjoys travelling.

## 2024-01-28 ENCOUNTER — Telehealth: Payer: Self-pay | Admitting: Family Medicine

## 2024-01-28 NOTE — Telephone Encounter (Signed)
 Copied from CRM [id number]. Topic: Clinical - Medication Refill >> Jan 28, 2024 12:20 PM Danelle Dunning F wrote: Patient called in stating he completed his AWV yesterday with a clinical staff member by the name of Thomasena Fleming; The patient stated she was suppose to put in the following prescription but did not. Patient is looking to leaving today and would like medication today.   Patient would like a callback in reference to this matter at : [phone]    Medication: tadalafil  (CIALIS ) 5 MG tablet  Has the patient contacted their pharmacy? Yes   This is the patient's preferred pharmacy:  [facility] [id number] - [place], [place] - [address] [place] [address] Phone: [phone] Fax: [phone]  Is this the correct pharmacy for this prescription? Yes   Has the prescription been filled recently? No  Is the patient out of the medication? Yes  Has the patient been seen for an appointment in the last year OR does the patient have an upcoming appointment? Yes  Can we respond through MyChart? Yes  Agent: Please be advised that Rx refills may take up to 3 business days. We ask that you follow-up with your pharmacy.

## 2024-01-28 NOTE — Telephone Encounter (Signed)
 Patient called in stating he completed his AWV yesterday with a clinical staff member by the name of Thomasena Fleming; The patient stated she was suppose to put in the following prescription but did not. Patient is looking to leaving today and would like medication today.    Patient would like a callback in reference to this matter at : [phone]       Medication: tadalafil  (CIALIS ) 5 MG tablet   Has the patient contacted their pharmacy? Yes     This is the patient's preferred pharmacy:  [facility] [id number] - [place], [place] - [address] [place] [address] Phone: [phone] Fax: [phone]   Is this the correct pharmacy for this prescription? Yes     Has the prescription been filled recently? No   Is the patient out of the medication? Yes   Has the patient been seen for an appointment in the last year OR does the patient have an upcoming appointment? Yes   Can we respond through MyChart? Yes   Agent: Please be advised that Rx refills may take up to 3 business days. We ask that you follow-up with your pharmacy.

## 2024-01-29 NOTE — Telephone Encounter (Signed)
 I left a message advising Rodger Civil wanted to refer to urology for there refills.

## 2024-03-10 ENCOUNTER — Ambulatory Visit: Admitting: Urgent Care

## 2024-03-14 ENCOUNTER — Encounter: Payer: Self-pay | Admitting: Urgent Care

## 2024-03-14 ENCOUNTER — Ambulatory Visit (INDEPENDENT_AMBULATORY_CARE_PROVIDER_SITE_OTHER): Admitting: Urgent Care

## 2024-03-14 VITALS — BP 111/74 | HR 60 | Resp 18 | Ht 72.0 in | Wt 200.0 lb

## 2024-03-14 DIAGNOSIS — I10 Essential (primary) hypertension: Secondary | ICD-10-CM | POA: Diagnosis not present

## 2024-03-14 DIAGNOSIS — Z125 Encounter for screening for malignant neoplasm of prostate: Secondary | ICD-10-CM | POA: Diagnosis not present

## 2024-03-14 DIAGNOSIS — I2510 Atherosclerotic heart disease of native coronary artery without angina pectoris: Secondary | ICD-10-CM | POA: Diagnosis not present

## 2024-03-14 DIAGNOSIS — Z9861 Coronary angioplasty status: Secondary | ICD-10-CM

## 2024-03-14 DIAGNOSIS — E7849 Other hyperlipidemia: Secondary | ICD-10-CM

## 2024-03-14 DIAGNOSIS — N529 Male erectile dysfunction, unspecified: Secondary | ICD-10-CM

## 2024-03-14 DIAGNOSIS — M5412 Radiculopathy, cervical region: Secondary | ICD-10-CM

## 2024-03-14 DIAGNOSIS — J029 Acute pharyngitis, unspecified: Secondary | ICD-10-CM | POA: Diagnosis not present

## 2024-03-14 DIAGNOSIS — R739 Hyperglycemia, unspecified: Secondary | ICD-10-CM | POA: Diagnosis not present

## 2024-03-14 MED ORDER — TELMISARTAN 20 MG PO TABS: 20.0000 mg | ORAL_TABLET | Freq: Every day | ORAL | 1 refills | Status: DC

## 2024-03-14 MED ORDER — EZETIMIBE 10 MG PO TABS: 10.0000 mg | ORAL_TABLET | Freq: Every day | ORAL | 1 refills | Status: DC

## 2024-03-14 MED ORDER — CHLORASEPTIC 1.4 % MT LIQD: 1.0000 | OROMUCOSAL | 0 refills | Status: DC | PRN

## 2024-03-14 MED ORDER — AMLODIPINE BESYLATE 10 MG PO TABS: 10.0000 mg | ORAL_TABLET | Freq: Every day | ORAL | 1 refills | Status: DC

## 2024-03-14 MED ORDER — ROSUVASTATIN CALCIUM 40 MG PO TABS: 40.0000 mg | ORAL_TABLET | Freq: Every day | ORAL | 3 refills | Status: DC

## 2024-03-14 MED ORDER — NITROGLYCERIN 0.4 MG SL SUBL
0.4000 mg | SUBLINGUAL_TABLET | SUBLINGUAL | 0 refills | Status: AC | PRN
Start: 2024-03-14 — End: ?

## 2024-03-14 MED ORDER — IPRATROPIUM BROMIDE 0.03 % NA SOLN: 2.0000 | Freq: Two times a day (BID) | NASAL | 0 refills | Status: DC

## 2024-03-14 MED ORDER — CLOPIDOGREL BISULFATE 75 MG PO TABS: 75.0000 mg | ORAL_TABLET | Freq: Every day | ORAL | 1 refills | Status: DC

## 2024-03-14 NOTE — Patient Instructions (Signed)
 I have refilled your cholesterol, blood pressure and plavix . Please discontinue cialis  as this is contraindicated with nitroglycerin . Please discuss alternative options with your urologist.  Please discontinue the omeprazole , this can interact with your plavix . If heartburn medication is needed, OTC zantac may be a better option.  Please return in 6 months for routine follow up.

## 2024-03-14 NOTE — Progress Notes (Signed)
 Established Patient Office Visit  Subjective:  Patient ID: Jerome Chase, male    DOB: 07/01/1951  Age: 73 y.o. MRN: [id number]  No chief complaint on file.   HPI  Discussed the use of AI scribe software for clinical note transcription with the patient, who gave verbal consent to proceed.  History of Present Illness   Jerome Chase is a 73 year old male with hypertension and coronary artery disease who presents with a sore throat and difficulty swallowing.  He has experienced a sore throat for the past three to four days, with a sensation of postnasal drainage. He occasionally has difficulty swallowing and feels the need to clear his throat, which provides some relief. No significant cheek or forehead pain, fever, swollen glands, or significant cough, although he sometimes coughs to clear his throat. He has not used any over-the-counter medications for these symptoms.  He is currently taking amlodipine  and telmisartan  for hypertension, which he monitors at home. He reports fluctuations in his blood pressure, with concern when it exceeds 140-150 mmHg or drops too low, causing him to feel unwell. His blood pressure is generally well-controlled, averaging in the 130s.  He is on rosuvastatin  and occasionally takes Zetia  (ezetimibe ) for cholesterol management. He experiences hip pain, which he suspects may be related to his cholesterol medications, especially when taken together. He had a myocardial infarction in August of last year and underwent stenting, and he follows up with a cardiologist. He takes clopidogrel  and aspirin regularly and sometimes uses gabapentin  for neck pain due to arthritis.  He reports sleeping only four to five hours per night, a pattern he has had for a long time. He wakes up frequently and sometimes has difficulty falling back asleep. 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 He has tried melatonin for  sleep but does not use it regularly.  He used to take omeprazole  for heartburn but no longer does as he can manage it with over-the-counter options. He also discusses the use of nitroglycerin , which he hasn't needed in years, and tadalafil , which he was advised against using due to potential interactions with his heart medications.      Patient Active Problem List   Diagnosis Date Noted   CAD S/P percutaneous coronary angioplasty 04/20/2023   Lumbar degenerative disc disease 01/08/2023   Primary osteoarthritis of right hip 01/08/2023   Generalized headaches 07/01/2022   H. 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   Left knee pain 07/13/2018   Calcaneal spur of foot, right 10/30/2017   Neck pain 10/30/2017   Screening for AAA (abdominal aortic aneurysm) 10/30/2017   AAA (abdominal aortic aneurysm) (HCC) 10/28/2017   Abnormal EKG 10/28/2017   Chest pain 10/28/2017   Primary osteoarthritis of left knee 10/28/2017   Left insertional Achilles tendinitis 10/28/2017   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   Tailor's bunion of both feet 09/20/2017   Ingrown nail 09/18/2017   Tenosynovitis of right foot 09/18/2017  Chronic heel pain, right 09/18/2017   Dyslipidemia 05/28/2017   Status post coronary artery stent placement 05/28/2017   Abnormal chest CT 05/28/2017   Coronary artery disease involving coronary bypass graft of native heart with angina pectoris (HCC) 05/20/2017   Intercostal pain 05/20/2017    Other hyperlipidemia 05/20/2017   Insomnia 07/16/2015   Essential hypertension 07/06/2015   Dyspnea on effort 07/06/2015   Past Medical History:  Diagnosis Date   AAA (abdominal aortic aneurysm) (HCC) 10/28/2017   Abnormal EKG 10/28/2017   Coronary artery disease involving coronary bypass graft of native heart with angina pectoris (HCC) 05/20/2017   Dyslipidemia 05/28/2017   Essential hypertension 07/06/2015   Heart attack (HCC)    Hypertension    Status post coronary artery stent placement 05/28/2017   Past Surgical History:  Procedure Laterality Date   CARDIAC SURGERY     Social History   Tobacco Use   Smoking status: Former    Current packs/day: 0.00    Average packs/day: 1 pack/day for 39.4 years (39.4 ttl pk-yrs)    Types: Cigarettes    Start date: 02/14/1966    Quit date: 07/05/2005    Years since quitting: 18.7   Smokeless tobacco: Never  Vaping Use   Vaping status: Never Used  Substance Use Topics   Alcohol use: Not Currently    Comment: occasional beer with a meal   Drug use: No      ROS: as noted in HPI  Objective:     BP 111/74 (BP Location: Left Arm, Patient Position: Sitting, Cuff Size: Normal)   Pulse 60   Resp 18   Ht 6' (1.829 m)   Wt 200 lb (90.7 kg)   SpO2 98%   BMI 27.12 kg/m  BP Readings from Last 3 Encounters:  03/14/24 111/74  10/20/23 104/63  07/15/23 113/70   Wt Readings from Last 3 Encounters:  03/14/24 200 lb (90.7 kg)  01/27/24 193 lb (87.5 kg)  10/20/23 204 lb (92.5 kg)      Physical Exam Vitals and nursing note reviewed.  Constitutional:      General: He is not in acute distress.    Appearance: Normal appearance. He is not ill-appearing, toxic-appearing or diaphoretic.  HENT:     Head: Normocephalic and atraumatic.     Right Ear: Tympanic membrane, ear canal and external ear normal. There is no impacted cerumen.     Left Ear: Tympanic membrane, ear canal and external ear normal. There is no impacted cerumen.     Nose:  Nose normal.     Right Turbinates: Not enlarged or swollen.     Left Turbinates: Not enlarged or swollen.     Right Sinus: No maxillary sinus tenderness or frontal sinus tenderness.     Left Sinus: No maxillary sinus tenderness or frontal sinus tenderness.     Mouth/Throat:     Mouth: Mucous membranes are moist.     Pharynx: Oropharynx is clear. No oropharyngeal exudate.     Comments: Minimal erythema to posterior pharynx, otherwise normal Eyes:     General: No scleral icterus.       Right eye: No discharge.        Left eye: No discharge.     Extraocular Movements: Extraocular movements intact.     Pupils: Pupils are equal, round, and reactive to light.  Neck:     Thyroid: No thyroid mass, thyromegaly or thyroid tenderness.  Cardiovascular:     Rate and Rhythm: Normal rate and  regular rhythm.     Pulses: Normal pulses.  Pulmonary:     Effort: Pulmonary effort is normal. No respiratory distress.     Breath sounds: Normal breath sounds. No stridor. No wheezing or rhonchi.  Musculoskeletal:     Cervical back: Normal range of motion and neck supple. No rigidity or tenderness.  Lymphadenopathy:     Cervical: No cervical adenopathy.  Skin:    General: Skin is warm and dry.     Coloration: Skin is not jaundiced.     Findings: No bruising, erythema or rash.  Neurological:     General: No focal deficit present.     Mental Status: He is alert and oriented to person, place, and time.  Psychiatric:        Mood and Affect: Mood normal.        Behavior: Behavior normal.      No results found for any visits on 03/14/24.  Last CBC Lab Results  Component Value Date   WBC 5.9 02/10/2023   HGB 14.7 02/10/2023   HCT 44.2 02/10/2023   MCV 88.4 02/10/2023   MCH 29.4 02/10/2023   RDW 13.9 02/10/2023   PLT 217 02/10/2023   Last metabolic panel Lab Results  Component Value Date   GLUCOSE 91 02/10/2023   NA 140 02/10/2023   K 4.6 02/10/2023   CL 104 02/10/2023   CO2 27 02/10/2023    BUN 15 02/10/2023   CREATININE 1.04 02/10/2023   EGFR 77 02/10/2023   CALCIUM  10.0 02/10/2023   PROT 7.6 05/07/2022   ALBUMIN 4.4 09/10/2017   BILITOT 0.8 05/07/2022   ALKPHOS 81 09/10/2017   AST 19 05/07/2022   ALT 17 05/07/2022   ANIONGAP 11.00 09/10/2017   Last lipids Lab Results  Component Value Date   CHOL 245 (A) 09/10/2017   HDL 34 (A) 09/10/2017   LDLCALC 135 09/10/2017   TRIG 384 (A) 09/10/2017      The ASCVD Risk score (Arnett DK, et al., 2019) failed to calculate for the following reasons:   Risk score cannot be calculated because patient has a medical history suggesting prior/existing ASCVD  Assessment & Plan:  Essential hypertension -     amLODIPine  Besylate; Take 1 tablet (10 mg total) by mouth daily.  Dispense: 90 tablet; Refill: 1 -     Telmisartan ; Take 1 tablet (20 mg total) by mouth daily.  Dispense: 90 tablet; Refill: 1 -     CBC with Differential/Platelet -     Hemoglobin A1c -     TSH -     Comprehensive metabolic panel with GFR  CAD S/P percutaneous coronary angioplasty -     Nitroglycerin ; Place 1 tablet (0.4 mg total) under the tongue every 5 (five) minutes as needed for chest pain.  Dispense: 30 tablet; Refill: 0 -     Clopidogrel  Bisulfate; Take 1 tablet (75 mg total) by mouth daily. 04/17/23: reports during [facility] call this was prescribed at time of hospital discharge from [facility] on 04/16/23  Dispense: 90 tablet; Refill: 1  Cervical radiculopathy  Other hyperlipidemia -     Ezetimibe ; Take 1 tablet (10 mg total) by mouth daily. 04/17/23: reports during [facility] call this was prescribed at time of hospital discharge from [facility] on 04/16/23  Dispense: 90 tablet; Refill: 1 -     Rosuvastatin  Calcium ; Take 1 tablet (40 mg total) by mouth daily.  Dispense: 90 tablet; Refill: 3 -     Lipid panel  Erectile  dysfunction, unspecified erectile dysfunction type -     PSA  Prostate cancer screening -     PSA  Sore throat -     Ipratropium Bromide ;  Place 2 sprays into both nostrils every 12 (twelve) hours for 3 days.  Dispense: 30 mL; Refill: 0 -     Chloraseptic; Use as directed 1 spray in the mouth or throat as needed for throat irritation / pain.  Dispense: 20 mL; Refill: 0  Assessment and Plan    Sore throat with postnasal drainage Symptoms likely due to postnasal drainage. - Prescribe nasal spray for postnasal drainage. - can use otc chloraseptic as needed  Coronary artery disease with myocardial infarction and stenting Discussed risks of expired nitroglycerin . - Refill clopidogrel , aspirin, and nitroglycerin  prescriptions.  ED Pt following with urology. Recommended an alternative to cialis , pt was not interested. Didn't feel it worked well - cannot refill cialis  due to med interactions - discuss alternative options with specialist.  Hypertension Hypertension well-controlled. Advised to monitor blood pressure and note correlating factors. - Continue amlodipine  and telmisartan . - Advise home blood pressure monitoring and documentation of correlating factors.  Hyperlipidemia Reports hip pain, possibly due to medication. Suggested discontinuing ezetimibe  if necessary (pt taking as needed) as hip pain more pronounced on days he takes. - Order lipid panel today. - Refill rosuvastatin  and zetia , question possibly stopping zetia  pending results.  Insomnia Advised longer sleep may improve well-being. Discussed combining melatonin with magnesium. - Suggest melatonin and magnesium combination for sleep.  Follow-up Labs outdated, nearly a year old. - Order updated laboratory tests. - Advise cardiologist follow-up as needed.         Return in about 6 months (around 09/14/2024).   Benton LITTIE Gave, PA

## 2024-03-15 ENCOUNTER — Ambulatory Visit: Payer: Self-pay | Admitting: Urgent Care

## 2024-03-15 DIAGNOSIS — D649 Anemia, unspecified: Secondary | ICD-10-CM

## 2024-03-15 LAB — COMPREHENSIVE METABOLIC PANEL WITH GFR
ALT: 17 IU/L (ref 0–44)
AST: 21 IU/L (ref 0–40)
Albumin: 4.6 g/dL (ref 3.8–4.8)
Alkaline Phosphatase: 98 IU/L (ref 44–121)
BUN/Creatinine Ratio: 16 (ref 10–24)
BUN: 14 mg/dL (ref 8–27)
Bilirubin Total: 0.6 mg/dL (ref 0.0–1.2)
CO2: 20 mmol/L (ref 20–29)
Calcium: 9.2 mg/dL (ref 8.6–10.2)
Chloride: 102 mmol/L (ref 96–106)
Creatinine, Ser: 0.89 mg/dL (ref 0.76–1.27)
Globulin, Total: 1.9 g/dL (ref 1.5–4.5)
Glucose: 83 mg/dL (ref 70–99)
Potassium: 4.2 mmol/L (ref 3.5–5.2)
Sodium: 137 mmol/L (ref 134–144)
Total Protein: 6.5 g/dL (ref 6.0–8.5)
eGFR: 91 mL/min/1.73 (ref >59)

## 2024-03-15 LAB — CBC WITH DIFFERENTIAL/PLATELET
Basophils Absolute: 0 x10E3/uL (ref 0.0–0.2)
Basos: 1 %
EOS (ABSOLUTE): 0.2 x10E3/uL (ref 0.0–0.4)
Eos: 5 %
Hematocrit: 38.7 % (ref 37.5–51.0)
Hemoglobin: 12.8 g/dL — ABNORMAL LOW (ref 13.0–17.7)
Immature Grans (Abs): 0 x10E3/uL (ref 0.0–0.1)
Immature Granulocytes: 0 %
Lymphocytes Absolute: 1.2 x10E3/uL (ref 0.7–3.1)
Lymphs: 28 %
MCH: 29 pg (ref 26.6–33.0)
MCHC: 33.1 g/dL (ref 31.5–35.7)
MCV: 88 fL (ref 79–97)
Monocytes Absolute: 0.5 x10E3/uL (ref 0.1–0.9)
Monocytes: 12 %
Neutrophils Absolute: 2.3 x10E3/uL (ref 1.4–7.0)
Neutrophils: 54 %
Platelets: 132 x10E3/uL — ABNORMAL LOW (ref 150–450)
RBC: 4.41 x10E6/uL (ref 4.14–5.80)
RDW: 14.4 % (ref 11.6–15.4)
WBC: 4.3 x10E3/uL (ref 3.4–10.8)

## 2024-03-15 LAB — TSH: TSH: 3.9 u[IU]/mL (ref 0.450–4.500)

## 2024-03-15 LAB — LIPID PANEL
Chol/HDL Ratio: 3.3 ratio (ref 0.0–5.0)
Cholesterol, Total: 128 mg/dL (ref 100–199)
HDL: 39 mg/dL — ABNORMAL LOW (ref >39)
LDL Chol Calc (NIH): 70 mg/dL (ref 0–99)
Triglycerides: 101 mg/dL (ref 0–149)
VLDL Cholesterol Cal: 19 mg/dL (ref 5–40)

## 2024-03-15 LAB — PSA: Prostate Specific Ag, Serum: 0.9 ng/mL (ref 0.0–4.0)

## 2024-03-15 LAB — HEMOGLOBIN A1C
Est. average glucose Bld gHb Est-mCnc: 111 mg/dL
Hgb A1c MFr Bld: 5.5 % (ref 4.8–5.6)

## 2024-04-12 DIAGNOSIS — D649 Anemia, unspecified: Secondary | ICD-10-CM | POA: Diagnosis not present

## 2024-04-12 DIAGNOSIS — R1310 Dysphagia, unspecified: Secondary | ICD-10-CM | POA: Diagnosis not present

## 2024-04-18 ENCOUNTER — Ambulatory Visit: Payer: Medicare HMO | Admitting: Family Medicine

## 2024-05-10 ENCOUNTER — Encounter: Payer: Self-pay | Admitting: Sports Medicine

## 2024-07-12 DIAGNOSIS — N400 Enlarged prostate without lower urinary tract symptoms: Secondary | ICD-10-CM | POA: Diagnosis not present

## 2024-07-12 DIAGNOSIS — N5201 Erectile dysfunction due to arterial insufficiency: Secondary | ICD-10-CM | POA: Diagnosis not present

## 2024-07-18 DIAGNOSIS — N400 Enlarged prostate without lower urinary tract symptoms: Secondary | ICD-10-CM | POA: Diagnosis not present

## 2024-07-20 DIAGNOSIS — N400 Enlarged prostate without lower urinary tract symptoms: Secondary | ICD-10-CM | POA: Diagnosis not present

## 2024-07-27 DIAGNOSIS — D509 Iron deficiency anemia, unspecified: Secondary | ICD-10-CM | POA: Diagnosis not present

## 2024-07-29 DIAGNOSIS — L600 Ingrowing nail: Secondary | ICD-10-CM | POA: Diagnosis not present

## 2024-07-29 DIAGNOSIS — M7741 Metatarsalgia, right foot: Secondary | ICD-10-CM | POA: Diagnosis not present

## 2024-07-29 DIAGNOSIS — M7742 Metatarsalgia, left foot: Secondary | ICD-10-CM | POA: Diagnosis not present

## 2024-08-08 DIAGNOSIS — Z4889 Encounter for other specified surgical aftercare: Secondary | ICD-10-CM | POA: Diagnosis not present

## 2024-09-11 ENCOUNTER — Other Ambulatory Visit: Payer: Self-pay | Admitting: Urgent Care

## 2024-09-11 DIAGNOSIS — E7849 Other hyperlipidemia: Secondary | ICD-10-CM

## 2024-09-14 ENCOUNTER — Ambulatory Visit: Admitting: Urgent Care

## 2024-09-22 ENCOUNTER — Ambulatory Visit: Admitting: Urgent Care

## 2024-09-22 ENCOUNTER — Encounter: Payer: Self-pay | Admitting: Urgent Care

## 2024-09-22 VITALS — BP 100/67 | HR 61 | Ht 72.0 in | Wt 191.0 lb

## 2024-09-22 DIAGNOSIS — D649 Anemia, unspecified: Secondary | ICD-10-CM | POA: Diagnosis not present

## 2024-09-22 DIAGNOSIS — I10 Essential (primary) hypertension: Secondary | ICD-10-CM | POA: Diagnosis not present

## 2024-09-22 DIAGNOSIS — R634 Abnormal weight loss: Secondary | ICD-10-CM

## 2024-09-22 DIAGNOSIS — R631 Polydipsia: Secondary | ICD-10-CM

## 2024-09-22 DIAGNOSIS — Z9861 Coronary angioplasty status: Secondary | ICD-10-CM

## 2024-09-22 DIAGNOSIS — I2510 Atherosclerotic heart disease of native coronary artery without angina pectoris: Secondary | ICD-10-CM

## 2024-09-22 DIAGNOSIS — S39012A Strain of muscle, fascia and tendon of lower back, initial encounter: Secondary | ICD-10-CM

## 2024-09-22 DIAGNOSIS — E7849 Other hyperlipidemia: Secondary | ICD-10-CM | POA: Diagnosis not present

## 2024-09-22 DIAGNOSIS — M791 Myalgia, unspecified site: Secondary | ICD-10-CM | POA: Diagnosis not present

## 2024-09-22 MED ORDER — TELMISARTAN 20 MG PO TABS: 20.0000 mg | ORAL_TABLET | Freq: Every day | ORAL | 1 refills | Status: AC

## 2024-09-22 MED ORDER — EZETIMIBE 10 MG PO TABS: 10.0000 mg | ORAL_TABLET | Freq: Every day | ORAL | 1 refills | Status: AC

## 2024-09-22 MED ORDER — ROSUVASTATIN CALCIUM 40 MG PO TABS: 40.0000 mg | ORAL_TABLET | Freq: Every day | ORAL | 3 refills | Status: AC

## 2024-09-22 MED ORDER — CLOPIDOGREL BISULFATE 75 MG PO TABS: 75.0000 mg | ORAL_TABLET | Freq: Every day | ORAL | 1 refills | Status: AC

## 2024-09-22 MED ORDER — AMLODIPINE BESYLATE 10 MG PO TABS: 10.0000 mg | ORAL_TABLET | Freq: Every day | ORAL | 1 refills | Status: AC

## 2024-09-22 NOTE — Patient Instructions (Addendum)
 We drew your labs today. Please return in 6 months, sooner as needed. Ensure you are adequately hydrated with water daily.

## 2024-09-22 NOTE — Progress Notes (Unsigned)
 "  Established Patient Office Visit  Subjective:  Patient ID: Jerome Chase, male    DOB: [birth year]  Age: 74 y.o. MRN: [id number]  Chief Complaint  Patient presents with   Medical Management of Chronic Issues    HPI  Patient Active Problem List   Diagnosis Date Noted   CAD S/P percutaneous coronary angioplasty 04/20/2023   Lumbar degenerative disc disease 01/08/2023   Primary osteoarthritis of right hip 01/08/2023   Generalized headaches 07/01/2022   H. 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   Left knee pain 07/13/2018   Calcaneal spur of foot, right 10/30/2017   Neck pain 10/30/2017   Screening for AAA (abdominal aortic aneurysm) 10/30/2017   AAA (abdominal aortic aneurysm) 10/28/2017   Abnormal EKG 10/28/2017   Chest pain 10/28/2017   Primary osteoarthritis of left knee 10/28/2017   Left insertional Achilles tendinitis 10/28/2017   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   Tailor's bunion of both feet 09/20/2017   Ingrown nail 09/18/2017   Tenosynovitis of right foot 09/18/2017   Chronic heel pain, right 09/18/2017   Dyslipidemia 05/28/2017   Status post coronary artery stent placement 05/28/2017   Abnormal chest CT 05/28/2017   Coronary artery disease involving coronary bypass graft of native heart with angina pectoris 05/20/2017   Intercostal pain 05/20/2017   Other hyperlipidemia 05/20/2017   Insomnia 07/16/2015   Essential hypertension 07/06/2015   Dyspnea on effort 07/06/2015    Past Medical History:  Diagnosis Date   AAA (abdominal aortic aneurysm) 10/28/2017   Abnormal EKG 10/28/2017   Coronary artery disease involving coronary bypass graft of native heart with angina pectoris 05/20/2017   Dyslipidemia 05/28/2017   Essential hypertension 07/06/2015   Heart attack (HCC)    Hypertension    Status post coronary artery stent placement 05/28/2017   Past Surgical History:  Procedure Laterality Date   CARDIAC SURGERY     Social History[1]    ROS: as noted in HPI  Objective:     BP 100/67   Pulse 61   Ht 6' (1.829 m)   Wt 191 lb (86.6 kg)   SpO2 99%   BMI 25.90 kg/m  BP Readings from Last 3 Encounters:  09/22/24 100/67  03/14/24 111/74  10/20/23 104/63   Wt Readings from Last 3 Encounters:  09/22/24 191 lb (86.6 kg)  03/14/24 200 lb (90.7 kg)  01/27/24 193 lb (87.5 kg)      Physical Exam   No results found for any visits on 09/22/24.  Last CBC Lab Results  Component Value Date   WBC 4.3 03/14/2024   HGB 12.8 (L) 03/14/2024   HCT 38.7 03/14/2024   MCV 88 03/14/2024   MCH 29.0 03/14/2024   RDW 14.4 03/14/2024   PLT 132 (L) 03/14/2024   Last metabolic panel Lab Results  Component Value Date   GLUCOSE 83 03/14/2024   NA 137 03/14/2024   K 4.2 03/14/2024   CL 102 03/14/2024   CO2 20 03/14/2024   BUN 14 03/14/2024   CREATININE 0.89  03/14/2024   EGFR 91 03/14/2024   CALCIUM  9.2 03/14/2024   PROT 6.5 03/14/2024   ALBUMIN 4.6 03/14/2024   LABGLOB 1.9 03/14/2024   BILITOT 0.6 03/14/2024   ALKPHOS 98 03/14/2024   AST 21 03/14/2024   ALT 17 03/14/2024   ANIONGAP 11.00 09/10/2017   Last lipids Lab Results  Component Value Date   CHOL 128 03/14/2024   HDL 39 (L) 03/14/2024   LDLCALC 70 03/14/2024   TRIG 101 03/14/2024   CHOLHDL 3.3 03/14/2024   Last hemoglobin A1c Lab Results  Component Value Date   HGBA1C 5.5 03/14/2024   Last thyroid  functions Lab Results  Component Value Date   TSH 3.900 03/14/2024   Last  vitamin D  Lab Results  Component Value Date   VD25OH 21.52 09/10/2017   Last vitamin B12 and Folate Lab Results  Component Value Date   VITAMINB12 494.00 09/10/2017   FOLATE 10.56 09/10/2017      The ASCVD Risk score (Arnett DK, et al., 2019) failed to calculate for the following reasons:   Risk score cannot be calculated because patient has a medical history suggesting prior/existing ASCVD   * - Cholesterol units were assumed  Assessment & Plan:  Other hyperlipidemia  Essential hypertension  Anemia, unspecified type     No follow-ups on file.   Benton LITTIE Gave, PA    [1]  Social History Tobacco Use   Smoking status: Former    Current packs/day: 0.00    Average packs/day: 1 pack/day for 39.4 years (39.4 ttl pk-yrs)    Types: Cigarettes    Start date: 02/14/1966    Quit date: 07/05/2005    Years since quitting: 19.2   Smokeless tobacco: Never  Vaping Use   Vaping status: Never Used  Substance Use Topics   Alcohol use: Not Currently    Comment: occasional beer with a meal   Drug use: No   "

## 2024-09-23 ENCOUNTER — Encounter: Payer: Self-pay | Admitting: Urgent Care

## 2024-09-23 ENCOUNTER — Ambulatory Visit: Payer: Self-pay | Admitting: Urgent Care

## 2024-09-24 LAB — CMP14+EGFR
ALT: 17 IU/L (ref 0–44)
AST: 23 IU/L (ref 0–40)
Albumin: 4.6 g/dL (ref 3.8–4.8)
Alkaline Phosphatase: 117 IU/L (ref 47–123)
BUN/Creatinine Ratio: 5 — ABNORMAL LOW (ref 10–24)
BUN: 4 mg/dL — ABNORMAL LOW (ref 8–27)
Bilirubin Total: 0.6 mg/dL (ref 0.0–1.2)
CO2: 20 mmol/L (ref 20–29)
Calcium: 9.9 mg/dL (ref 8.6–10.2)
Chloride: 102 mmol/L (ref 96–106)
Creatinine, Ser: 0.87 mg/dL (ref 0.76–1.27)
Globulin, Total: 2.2 g/dL (ref 1.5–4.5)
Glucose: 91 mg/dL (ref 70–99)
Potassium: 4.3 mmol/L (ref 3.5–5.2)
Sodium: 139 mmol/L (ref 134–144)
Total Protein: 6.8 g/dL (ref 6.0–8.5)
eGFR: 91 mL/min/1.73

## 2024-09-24 LAB — CBC WITH DIFFERENTIAL/PLATELET
Basophils Absolute: 0 x10E3/uL (ref 0.0–0.2)
Basos: 1 %
EOS (ABSOLUTE): 0.3 x10E3/uL (ref 0.0–0.4)
Eos: 6 %
Hematocrit: 38.9 % (ref 37.5–51.0)
Hemoglobin: 13.4 g/dL (ref 13.0–17.7)
Immature Grans (Abs): 0 x10E3/uL (ref 0.0–0.1)
Immature Granulocytes: 0 %
Lymphocytes Absolute: 1.1 x10E3/uL (ref 0.7–3.1)
Lymphs: 19 %
MCH: 29.8 pg (ref 26.6–33.0)
MCHC: 34.4 g/dL (ref 31.5–35.7)
MCV: 86 fL (ref 79–97)
Monocytes Absolute: 0.5 x10E3/uL (ref 0.1–0.9)
Monocytes: 8 %
Neutrophils Absolute: 3.7 x10E3/uL (ref 1.4–7.0)
Neutrophils: 66 %
Platelets: 219 x10E3/uL (ref 150–450)
RBC: 4.5 x10E6/uL (ref 4.14–5.80)
RDW: 13.9 % (ref 11.6–15.4)
WBC: 5.7 x10E3/uL (ref 3.4–10.8)

## 2024-09-24 LAB — CK: Total CK: 211 U/L (ref 41–331)

## 2024-09-24 LAB — HEMOGLOBIN A1C
Est. average glucose Bld gHb Est-mCnc: 108 mg/dL
Hgb A1c MFr Bld: 5.4 % (ref 4.8–5.6)

## 2024-09-24 LAB — IRON,TIBC AND FERRITIN PANEL
Ferritin: 339 ng/mL (ref 30–400)
Iron Saturation: 26 % (ref 15–55)
Iron: 91 ug/dL (ref 38–169)
Total Iron Binding Capacity: 347 ug/dL (ref 250–450)
UIBC: 256 ug/dL (ref 111–343)

## 2024-09-24 LAB — TSH: TSH: 3.71 u[IU]/mL (ref 0.450–4.500)

## 2024-10-03 ENCOUNTER — Encounter: Payer: Self-pay | Admitting: Urgent Care

## 2024-10-03 DIAGNOSIS — M545 Low back pain, unspecified: Secondary | ICD-10-CM

## 2024-10-03 MED ORDER — METHOCARBAMOL 750 MG PO TABS: 750.0000 mg | ORAL_TABLET | Freq: Four times a day (QID) | ORAL | 0 refills | Status: AC | PRN

## 2025-01-31 ENCOUNTER — Ambulatory Visit

## 2025-03-22 ENCOUNTER — Encounter: Admitting: Urgent Care
